# Patient Record
Sex: Male | Born: 1960 | Race: White | Hispanic: No | Marital: Married | Smoking: Never smoker
Health system: Southern US, Community
[De-identification: ages and names within clinical notes are randomized; demographics above are authoritative.]

## PROBLEM LIST (undated history)

## (undated) DIAGNOSIS — Z98811 Dental restoration status: Secondary | ICD-10-CM

## (undated) DIAGNOSIS — K219 Gastro-esophageal reflux disease without esophagitis: Secondary | ICD-10-CM

## (undated) DIAGNOSIS — K222 Esophageal obstruction: Secondary | ICD-10-CM

## (undated) DIAGNOSIS — K921 Melena: Secondary | ICD-10-CM

## (undated) DIAGNOSIS — Z125 Encounter for screening for malignant neoplasm of prostate: Secondary | ICD-10-CM

## (undated) DIAGNOSIS — R739 Hyperglycemia, unspecified: Secondary | ICD-10-CM

## (undated) HISTORY — DX: Esophageal obstruction: K22.2

## (undated) HISTORY — DX: Melena: K92.1

## (undated) HISTORY — DX: Encounter for screening for malignant neoplasm of prostate: Z12.5

## (undated) HISTORY — PX: DENTAL SURGERY: SHX609

## (undated) HISTORY — DX: Hyperglycemia, unspecified: R73.9

## (undated) HISTORY — PX: OTHER SURGICAL HISTORY: SHX169

---

## 2000-04-15 ENCOUNTER — Encounter: Payer: Self-pay | Admitting: Family Medicine

## 2001-10-17 ENCOUNTER — Encounter: Payer: Self-pay | Admitting: Family Medicine

## 2002-11-27 ENCOUNTER — Encounter: Payer: Self-pay | Admitting: Family Medicine

## 2002-11-27 LAB — CONVERTED CEMR LAB
RBC count: 4.8 10*6/uL
TSH: 1.01 microintl units/mL
WBC, blood: 6.8 10*3/uL

## 2003-11-27 ENCOUNTER — Encounter: Payer: Self-pay | Admitting: Family Medicine

## 2003-11-27 LAB — CONVERTED CEMR LAB: TSH: 0.74 microintl units/mL

## 2004-07-19 HISTORY — PX: GUM SURGERY: SHX658

## 2004-10-13 ENCOUNTER — Ambulatory Visit: Payer: Self-pay | Admitting: Family Medicine

## 2005-04-12 ENCOUNTER — Ambulatory Visit: Payer: Self-pay | Admitting: Family Medicine

## 2005-04-12 LAB — CONVERTED CEMR LAB: Blood Glucose, Fasting: 108 mg/dL

## 2005-04-15 ENCOUNTER — Ambulatory Visit: Payer: Self-pay | Admitting: Family Medicine

## 2005-10-17 ENCOUNTER — Encounter: Payer: Self-pay | Admitting: Family Medicine

## 2006-02-16 HISTORY — PX: OTHER SURGICAL HISTORY: SHX169

## 2006-05-25 ENCOUNTER — Ambulatory Visit: Payer: Self-pay | Admitting: Family Medicine

## 2006-05-30 ENCOUNTER — Ambulatory Visit: Payer: Self-pay | Admitting: Family Medicine

## 2007-05-12 ENCOUNTER — Encounter: Payer: Self-pay | Admitting: Family Medicine

## 2007-05-12 DIAGNOSIS — Z8639 Personal history of other endocrine, nutritional and metabolic disease: Secondary | ICD-10-CM

## 2007-05-30 ENCOUNTER — Ambulatory Visit: Payer: Self-pay | Admitting: Family Medicine

## 2007-05-30 LAB — CONVERTED CEMR LAB
Calcium: 9.9 mg/dL (ref 8.4–10.5)
Cholesterol: 182 mg/dL (ref 0–200)
GFR calc Af Amer: 92 mL/min
GFR calc non Af Amer: 76 mL/min
Glucose, Bld: 105 mg/dL — ABNORMAL HIGH (ref 70–99)
LDL Cholesterol: 118 mg/dL — ABNORMAL HIGH (ref 0–99)
Potassium: 4.3 meq/L (ref 3.5–5.1)
Sodium: 140 meq/L (ref 135–145)
Total CHOL/HDL Ratio: 3.4
Triglycerides: 53 mg/dL (ref 0–149)

## 2007-06-01 ENCOUNTER — Ambulatory Visit: Payer: Self-pay | Admitting: Family Medicine

## 2007-07-13 ENCOUNTER — Encounter: Payer: Self-pay | Admitting: Family Medicine

## 2007-07-13 ENCOUNTER — Ambulatory Visit: Payer: Self-pay | Admitting: Gastroenterology

## 2007-07-13 DIAGNOSIS — K222 Esophageal obstruction: Secondary | ICD-10-CM | POA: Insufficient documentation

## 2007-07-13 HISTORY — PX: ESOPHAGOGASTRODUODENOSCOPY: SHX1529

## 2007-08-03 HISTORY — PX: ESOPHAGOGASTRODUODENOSCOPY: SHX1529

## 2007-08-07 ENCOUNTER — Ambulatory Visit: Payer: Self-pay | Admitting: Gastroenterology

## 2007-12-22 ENCOUNTER — Ambulatory Visit: Payer: Self-pay | Admitting: Family Medicine

## 2007-12-22 LAB — CONVERTED CEMR LAB
Eosinophils Absolute: 0.2 10*3/uL (ref 0.0–0.7)
HCT: 41.9 % (ref 39.0–52.0)
Hemoglobin: 14.7 g/dL (ref 13.0–17.0)
MCHC: 35.2 g/dL (ref 30.0–36.0)
MCV: 90.1 fL (ref 78.0–100.0)
Mono Screen: NEGATIVE
Monocytes Absolute: 0.6 10*3/uL (ref 0.1–1.0)
Neutro Abs: 3.6 10*3/uL (ref 1.4–7.7)
Platelets: 206 10*3/uL (ref 150–400)
RDW: 11.8 % (ref 11.5–14.6)

## 2008-01-13 IMAGING — CR DG CHEST 2V
1 series · 3 of 3 positions shown · non-contrast
Comparison: none

REASON FOR EXAM: swallowing difficulty, dysphagia
COMMENTS:

[Series 1: view not recorded · 0.17mm/px · 3 of 3 slices shown]
[im 1/3]
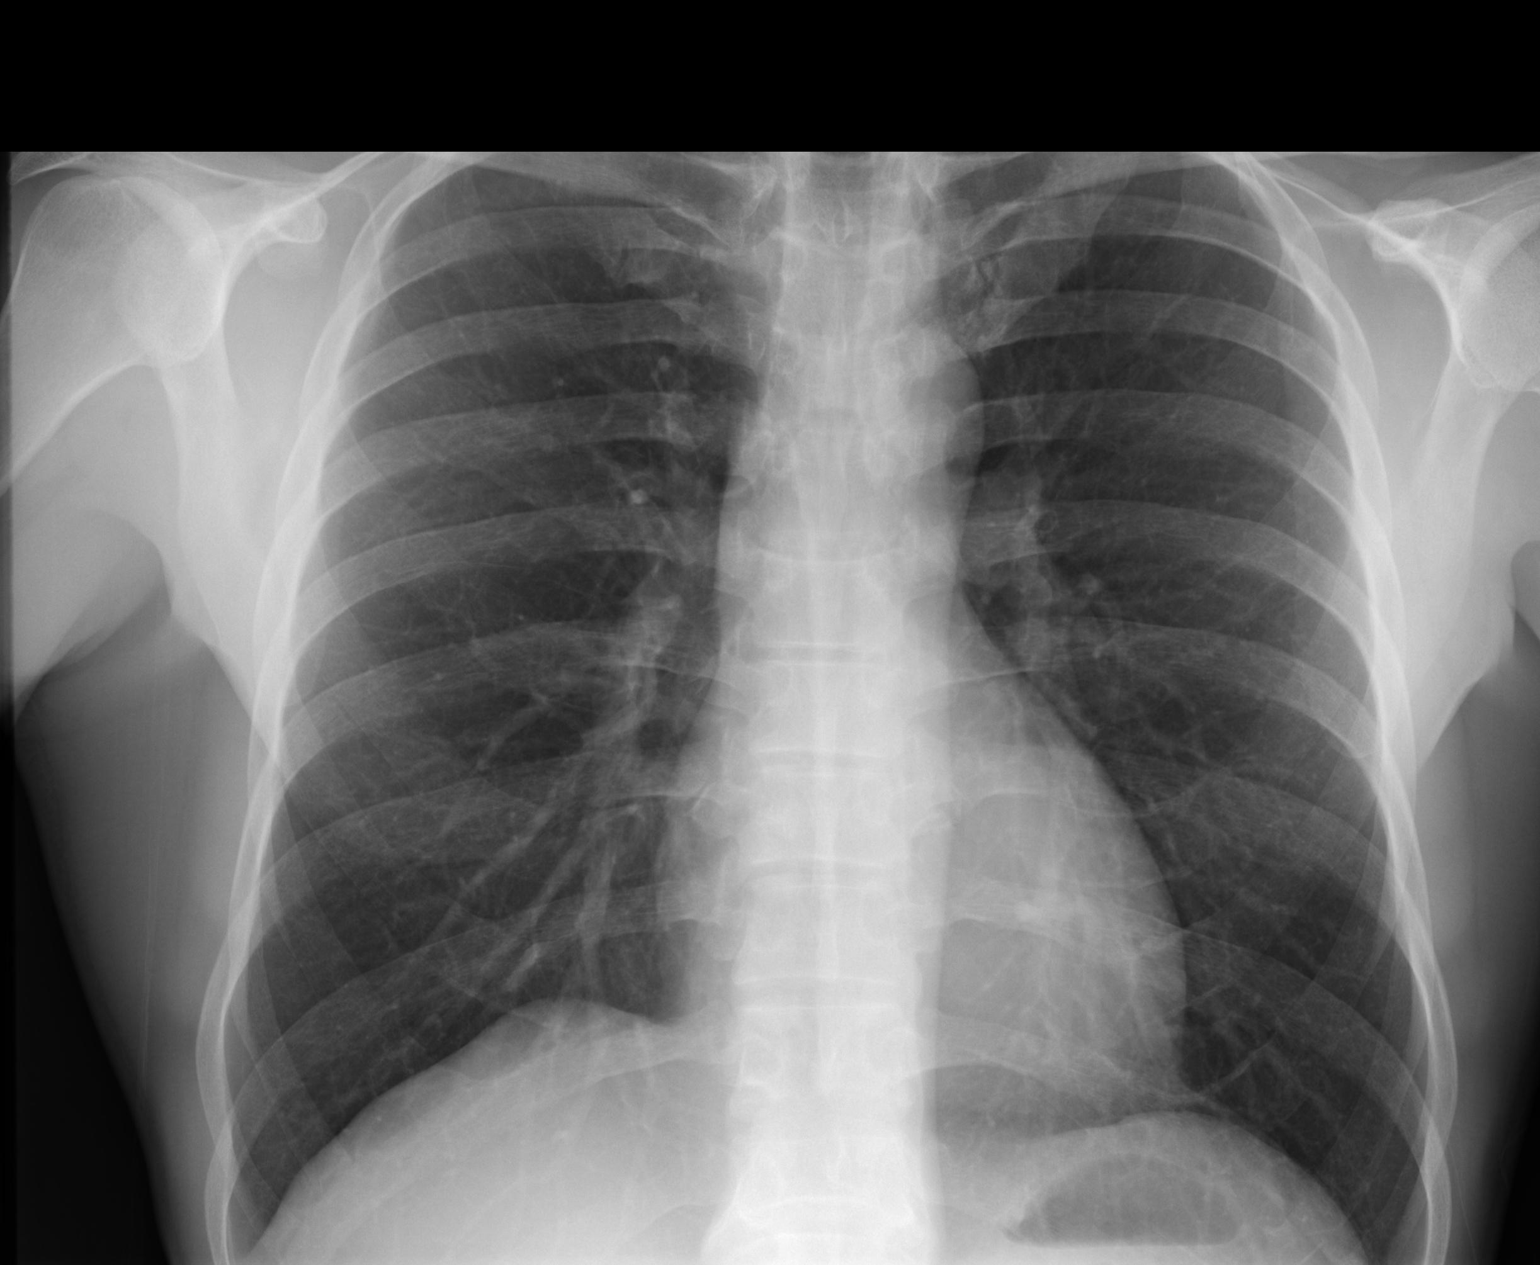
[im 2/3]
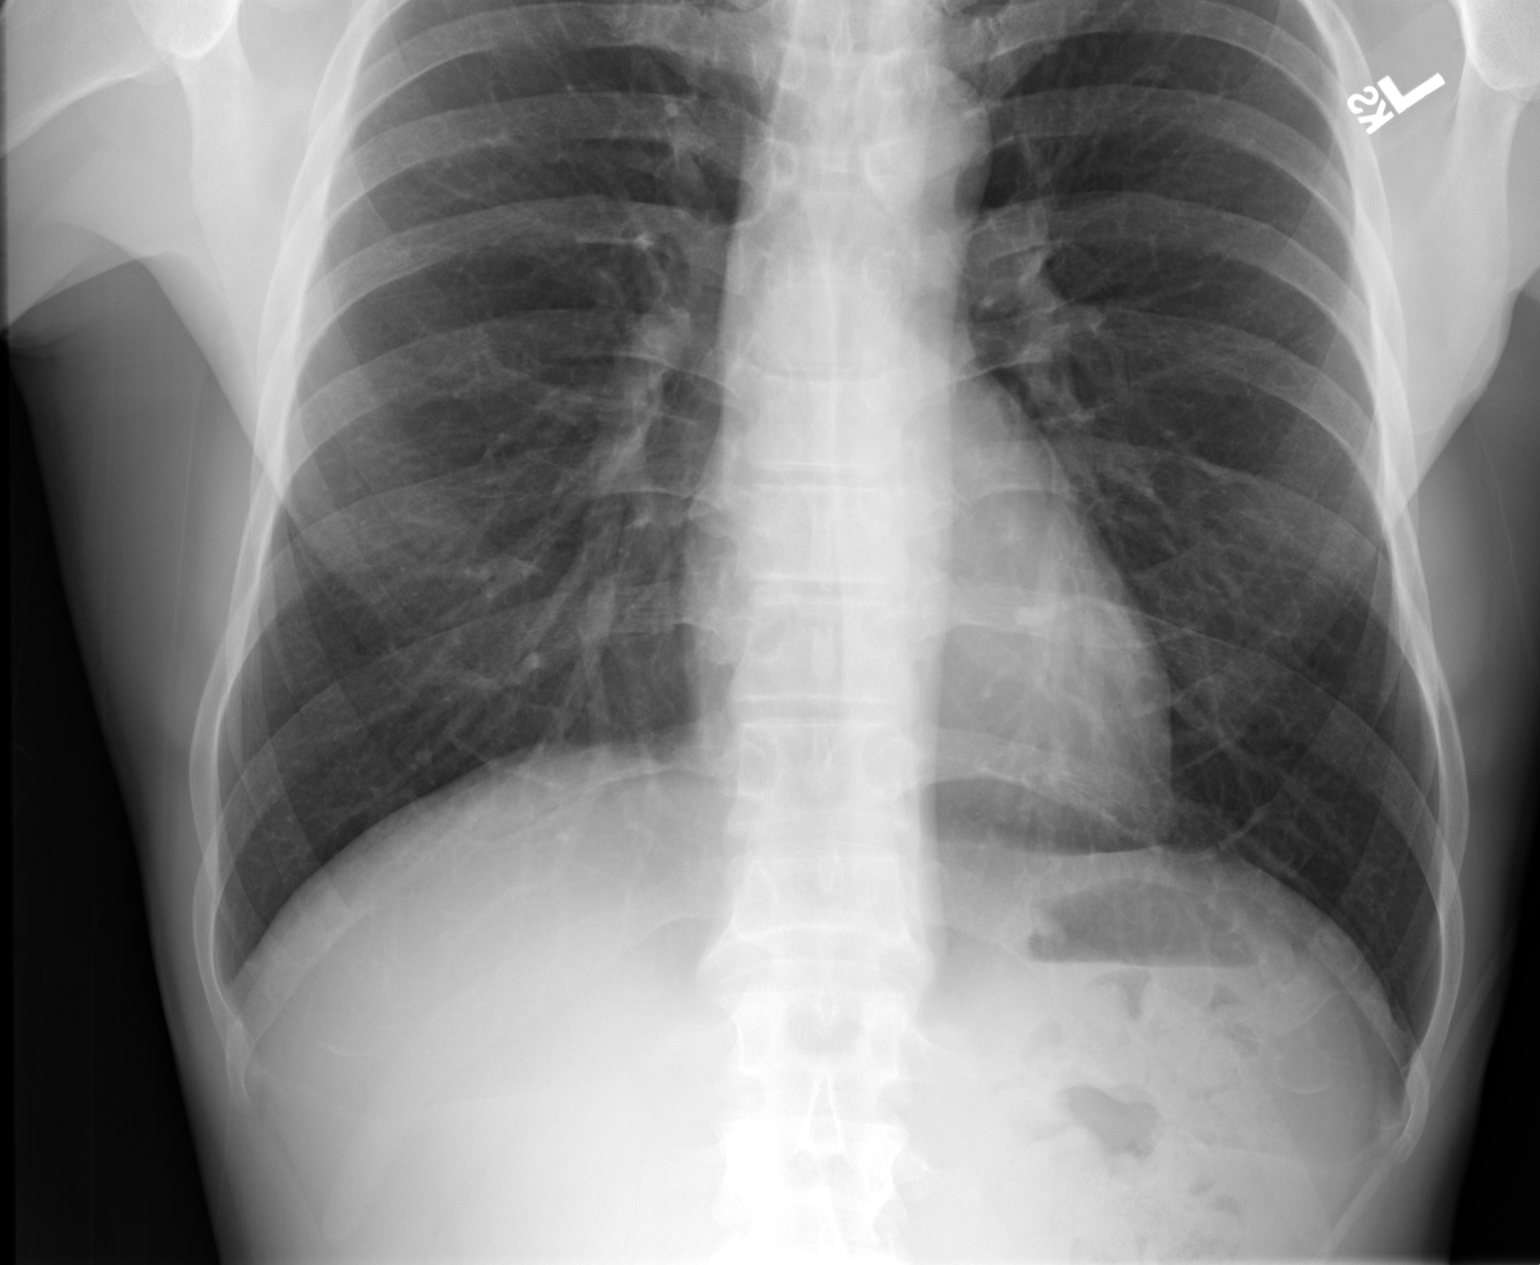
[im 3/3]
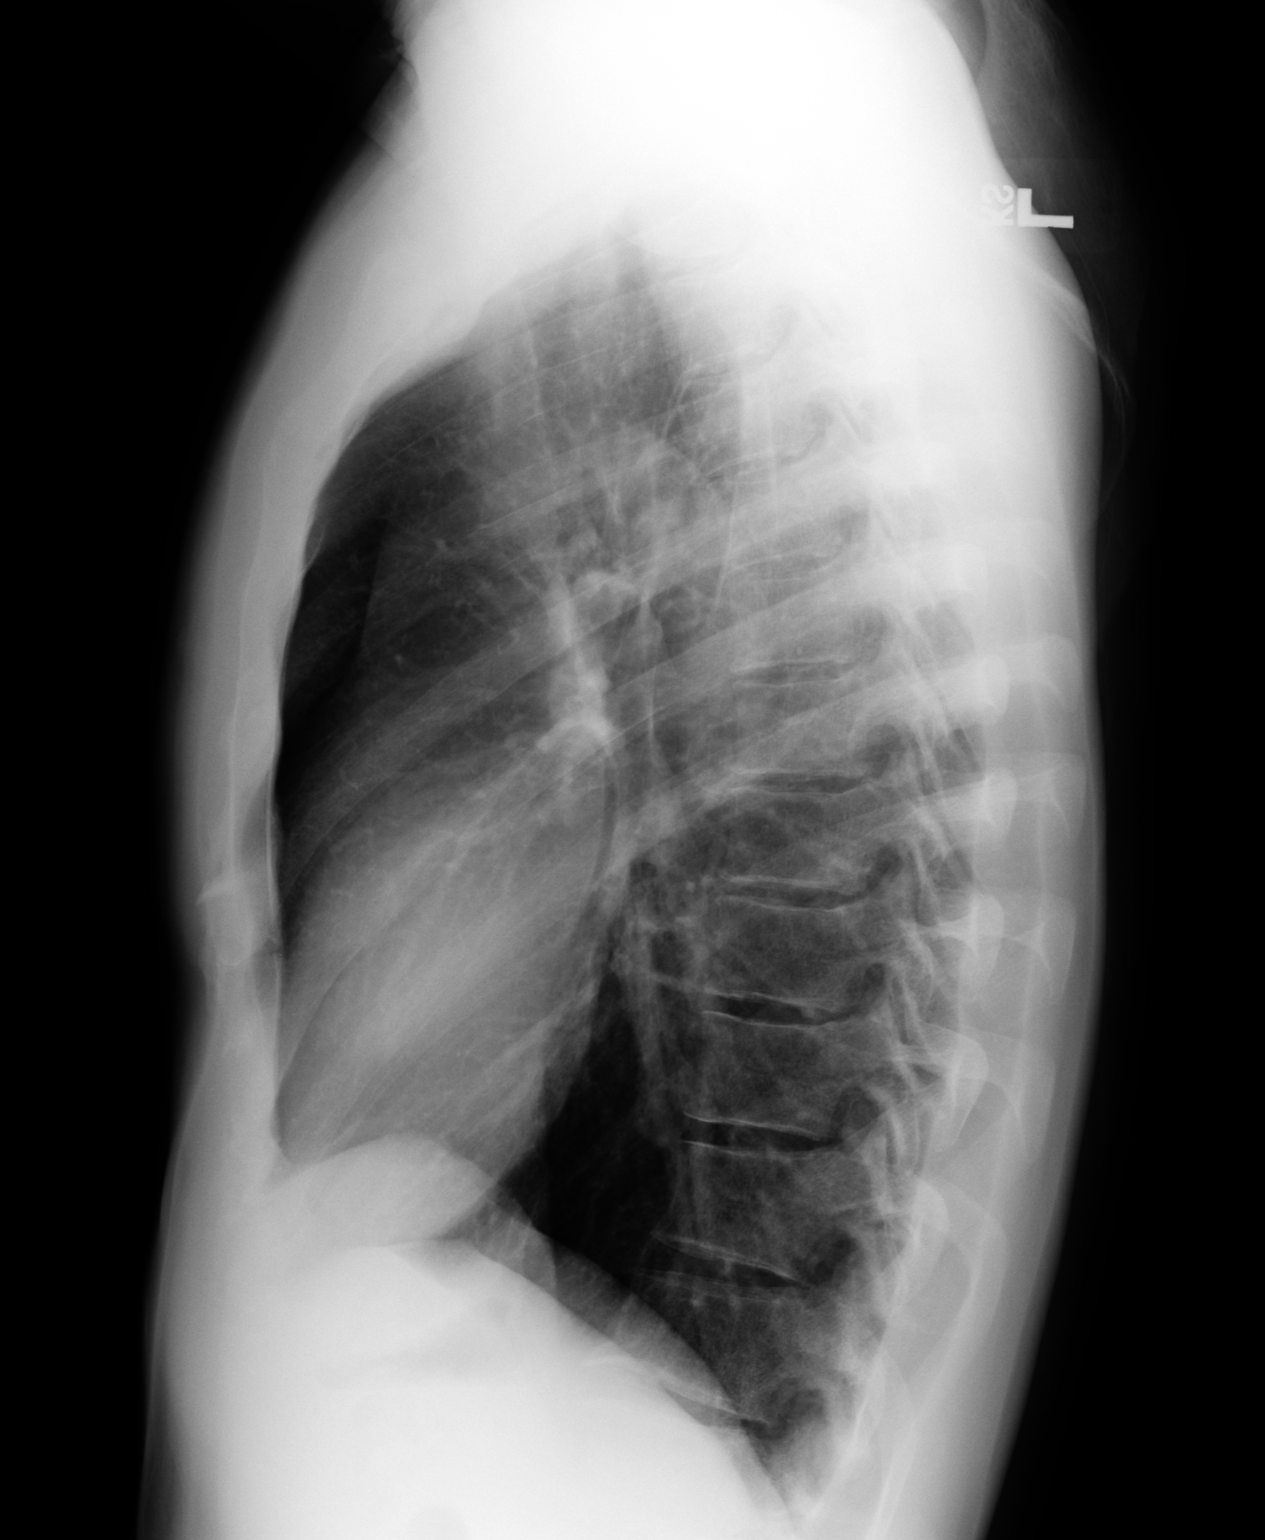

[3 of 3 positions shown; findings below may reference images not displayed]

PROCEDURE:     DXR - DXR CHEST PA (OR AP) AND LATERAL  - July 13, 2007  [DATE]

RESULT:     The lungs are mildly hyperinflated. There is no focal
infiltrate. The heart is normal in size and the pulmonary vascularity is not
engorged. The mediastinum is not widened. The trachea is normal in position.
No air-fluid levels in the region of the esophagus are seen.
IMPRESSION: There is very mild hyperinflation which may be voluntary or may reflect air
trapping. I do not see evidence of pneumonia.

## 2008-06-03 ENCOUNTER — Ambulatory Visit: Payer: Self-pay | Admitting: Family Medicine

## 2008-06-03 LAB — CONVERTED CEMR LAB
AST: 32 units/L (ref 0–37)
Albumin: 4.2 g/dL (ref 3.5–5.2)
Alkaline Phosphatase: 63 units/L (ref 39–117)
BUN: 13 mg/dL (ref 6–23)
Bilirubin, Direct: 0.1 mg/dL (ref 0.0–0.3)
Chloride: 106 meq/L (ref 96–112)
Cholesterol: 178 mg/dL (ref 0–200)
GFR calc Af Amer: 103 mL/min
GFR calc non Af Amer: 85 mL/min
Glucose, Bld: 98 mg/dL (ref 70–99)
Potassium: 4.2 meq/L (ref 3.5–5.1)
Sodium: 141 meq/L (ref 135–145)
Total Protein: 6.9 g/dL (ref 6.0–8.3)

## 2008-06-05 ENCOUNTER — Ambulatory Visit: Payer: Self-pay | Admitting: Family Medicine

## 2008-07-19 HISTORY — PX: COLONOSCOPY: SHX174

## 2009-06-04 ENCOUNTER — Ambulatory Visit: Payer: Self-pay | Admitting: Family Medicine

## 2009-06-04 LAB — CONVERTED CEMR LAB
AST: 28 units/L (ref 0–37)
Albumin: 4.3 g/dL (ref 3.5–5.2)
Alkaline Phosphatase: 62 units/L (ref 39–117)
BUN: 14 mg/dL (ref 6–23)
CO2: 29 meq/L (ref 19–32)
Cholesterol: 178 mg/dL (ref 0–200)
Creatinine,U: 198.6 mg/dL
GFR calc non Af Amer: 75.35 mL/min (ref >60)
Glucose, Bld: 99 mg/dL (ref 70–99)
Microalb Creat Ratio: 2.5 mg/g (ref 0.0–30.0)
Microalb, Ur: 0.5 mg/dL (ref 0.0–1.9)
PSA: 0.64 ng/mL (ref 0.10–4.00)
Potassium: 4.4 meq/L (ref 3.5–5.1)
Sodium: 141 meq/L (ref 135–145)
TSH: 0.98 microintl units/mL (ref 0.35–5.50)
Total Protein: 6.9 g/dL (ref 6.0–8.3)

## 2009-06-09 ENCOUNTER — Ambulatory Visit: Payer: Self-pay | Admitting: Family Medicine

## 2009-06-13 ENCOUNTER — Encounter: Payer: Self-pay | Admitting: Family Medicine

## 2009-07-07 ENCOUNTER — Ambulatory Visit: Payer: Self-pay | Admitting: Gastroenterology

## 2009-07-07 ENCOUNTER — Encounter: Payer: Self-pay | Admitting: Family Medicine

## 2010-02-24 ENCOUNTER — Encounter (INDEPENDENT_AMBULATORY_CARE_PROVIDER_SITE_OTHER): Payer: Self-pay | Admitting: *Deleted

## 2010-06-03 ENCOUNTER — Telehealth (INDEPENDENT_AMBULATORY_CARE_PROVIDER_SITE_OTHER): Payer: Self-pay | Admitting: *Deleted

## 2010-06-10 ENCOUNTER — Ambulatory Visit: Payer: Self-pay | Admitting: Family Medicine

## 2010-06-14 LAB — CONVERTED CEMR LAB
ALT: 15 units/L (ref 0–53)
AST: 24 units/L (ref 0–37)
Albumin: 4.6 g/dL (ref 3.5–5.2)
Alkaline Phosphatase: 65 units/L (ref 39–117)
Calcium: 9.9 mg/dL (ref 8.4–10.5)
Cholesterol: 172 mg/dL (ref 0–200)
Creatinine, Ser: 1.09 mg/dL (ref 0.40–1.50)
HDL: 61 mg/dL (ref >39)
Sodium: 140 meq/L (ref 135–145)
Total Bilirubin: 1.1 mg/dL (ref 0.3–1.2)
Total CHOL/HDL Ratio: 2.8
Triglycerides: 54 mg/dL (ref <150)

## 2010-06-16 ENCOUNTER — Ambulatory Visit: Payer: Self-pay | Admitting: Family Medicine

## 2010-08-18 ENCOUNTER — Ambulatory Visit
Admission: RE | Admit: 2010-08-18 | Discharge: 2010-08-18 | Payer: Self-pay | Source: Home / Self Care | Attending: Family Medicine | Admitting: Family Medicine

## 2010-08-18 ENCOUNTER — Encounter: Payer: Self-pay | Admitting: Family Medicine

## 2010-08-18 NOTE — Consult Note (Signed)
Summary: Dr[facility]kat [facility],Note  Dr[facility]Shaukat [facility],Note   Imported By: Beau Fanny 06/25/2009 16:33:55  _____________________________________________________________________  External Attachment:    Type:   Image     Comment:   External Document

## 2010-08-18 NOTE — Procedures (Signed)
Summary: Colonoscopy by Dr. Ruthine Dose  Colonoscopy by Dr. Ruthine Dose   Imported By: Beau Fanny 07/15/2009 09:47:49  _____________________________________________________________________  External Attachment:    Type:   Image     Comment:   External Document  Appended Document: Colonoscopy by Dr. 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 BRENNAN;(02/2006)    EGD B9 STRICTURE, ESOPH FB,  (DR WOHL) 07/13/2007  REPEAT 3 WKS    EGD DILATION (DR WOHL) 08/03/2007    COLONOSCOPY INT HEMMS (DR Tresa Res) 07/07/2009    Preventive Care Screening  Colonoscopy:    Date:  07/07/2009    Results:  normal

## 2010-08-18 NOTE — Progress Notes (Signed)
----   Converted from flag ---- ---- 06/02/2010 2:07 PM, Crawford Givens MD wrote: cmet lipid 790.29 psa v76.44  ---- 06/02/2010 10:57 AM, Liane Comber CMA (AAMA) wrote: Lab orders please! Good Morning! This pt is scheduled for cpx labs Wed, which labs to draw and dx codes to use? Thanks Tasha ------------------------------

## 2010-08-18 NOTE — Assessment & Plan Note (Signed)
Summary: CPX,TRANSFER FROM DR SCHALLER/CLE   Vital Signs:  Patient profile:   50 year old male Height:      72 inches Weight:      166.25 pounds BMI:     22.63 Temp:     97.4 degrees F oral Pulse rate:   76 / minute Pulse rhythm:   regular BP sitting:   122 / 82  (left arm) Cuff size:   regular  Vitals Entered By: Delilah Shan CMA Duncan Dull) (June 16, 2010 8:47 AM) CC: CPX (RNS pt.), Preventive Care   History of Present Illness: CPE- See prev med. Labs reviewed with patient. Mild hyperglycemia, similar to prev.  D/w patient UU:VOZD and exercise.  See plan.  Feeling well o/w except for occ mild nasal congestion on L side.  No other c/o.   Allergies: No Known Drug Allergies  Past History:  Past Surgical History: Last updated: 07/15/2009 CYSTIC HYGROMA REMOVAL LEFT  1961 CYSTIC HYGROMA REMOVAL LEFT [facility]  1982 GUM SURGERY 2006 LEFT LOWER EYELID LESION REMOVAL--DR. 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 Father: DECEASED 106 YOA MVA Mother: A  HTN CHOL  BASAL CELL CHOLEY Siblings: NONE CV: +3 OD GRANDPARENTS DECEASED FROM CAD, 1 60 YOA ;PGM      //2- 70 YOAM +PGF HBP:+MOTHER DM:+PGF GOUT/ARTHRITIS: PROSTATE CANCER: MGM CHRONIC LEUKEMIA BREAST/OVARIAN/UTERINE CANCER: COLON CANCER: DEPRESSION: NEGATIVE ETOH/DRUG ABUSE: NEGATIVE OTHER: NEGATIVE STROKE  Social History: Reviewed history from 06/01/2007 and no changes required. Marital Status: Married, 1985 LIVES WITH WIFE Children: 2 CHILDREN, both out of  the home Occupation: B[employer]/ [employer] grad 1983 enjoys tennis, water skiing, swimming no tob alcohol: 2 beers a month  Review of Systems       See HPI.  Otherwise negative.    Physical Exam  General:  GEN: nad, alert and oriented HEENT: mucous membranes moist, TM wnl, nasal exam wnl except for slight decrease in L nasal passage diameter compared to R NECK: supple w/o LA, post surgical changes noted, no bruit CV: rrr.  no murmur PULM: ctab, no inc wob ABD: soft, +bs EXT: no edema SKIN: no acute rash  Rectal:  No external abnormalities noted. Normal sphincter tone. No rectal masses or tenderness. Prostate:  Prostate gland firm and smooth, mild enlargement, but no nodularity, tenderness, mass, asymmetry or induration.   Impression & Recommendations:  Problem # 1:  Preventive Health Care (ICD-V70.0) Stool heme neg today, flu declined.  I would recheck only glucose next year.  Cont healthy diet and exercise in meantime.  His lipids and psa are okay this year.  We talked about PSAs and there is no clear indication for yearly screening for him.  Consider recheck in the future.  follow up as needed.  Tdap up to date.   Complete Medication List: 1)  Multivitamins Tabs (Multiple vitamin) .... Take 1 tablet by mouth once a day 2)  Vitamin C 500 Mg Tabs (Ascorbic acid) .... Take 1 tablet by mouth once a day  Colorectal Screening:  Current Recommendations:    Hemoccult: NEG X 1 today  Next Colonoscopy Due:    07/08/2019  PSA Screening:    PSA: 0.49  (06/10/2010)  Immunization & Chemoprophylaxis:    Tetanus vaccine: Tdap  (06/05/2008)  Patient Instructions: 1)  Keep exercising and eating a healthy diet.  Take care.  Think about getting a flu shot.  I would recheck your sugar next year, but I don't see an indication to check your PSA or lipids yearly.  Let me know if you have other concerns.     Orders Added: 1)  Est. Patient 40-64 years  [99396]    Current Allergies (reviewed today): No known allergies

## 2010-08-18 NOTE — Letter (Signed)
Summary: [name] letter  [place] at [facility]  [address] [place], [place] [address]   Phone: [phone]  Fax: [phone]       02/24/2010 MRN: [id number]  [facility] [place] [address] [place], [place]  [address]  Dear Mr. Thomas Welch [facility] [place], and [facility] announce the retirement of Thomas Welch, M.D., from full-time practice at the [facility] office effective January 15, 2010 and his plans of returning part-time.  It is important to [name] and to our practice that you understand that [facility] has seven physicians in our office for your health care needs.  We will continue to offer the same exceptional care that you have today.    [name] has spoken to many of you about his plans for retirement and returning part-time in the fall.   We will continue to work with you through the transition to schedule appointments for you in the office and meet the high standards that [facility] is committed to.   Again, it is with great pleasure that we share the news that [name] will return to [facility] at [facility] in October of 2011 with a reduced schedule.    If you have any questions, or would like to request an appointment with one of our physicians, please call us at [phone] and press the option for Scheduling an appointment.  We take pleasure in providing you with excellent patient care and look forward to seeing you at your next office visit.  Our [facility] Physicians are:  Thomas Welch, M.D. Thomas Welch, M.D. Thomas Welch, M.D. Thomas Welch, M.D. Thomas Welch, M.D. Thomas Welch, M.D. We proudly welcomed Thomas Welch, M.D. and Thomas Welch, M.D. to the practice in July/August 2011.  Sincerely,  [place]

## 2010-08-21 ENCOUNTER — Telehealth: Payer: Self-pay | Admitting: Family Medicine

## 2010-08-24 ENCOUNTER — Other Ambulatory Visit (INDEPENDENT_AMBULATORY_CARE_PROVIDER_SITE_OTHER): Payer: BC Managed Care – PPO

## 2010-08-24 ENCOUNTER — Encounter (INDEPENDENT_AMBULATORY_CARE_PROVIDER_SITE_OTHER): Payer: Self-pay | Admitting: *Deleted

## 2010-08-24 ENCOUNTER — Other Ambulatory Visit: Payer: Self-pay | Admitting: Family Medicine

## 2010-08-24 DIAGNOSIS — Z0000 Encounter for general adult medical examination without abnormal findings: Secondary | ICD-10-CM

## 2010-08-24 DIAGNOSIS — R7309 Other abnormal glucose: Secondary | ICD-10-CM

## 2010-08-24 LAB — GLUCOSE, RANDOM: Glucose, Bld: 106 mg/dL — ABNORMAL HIGH (ref 70–99)

## 2010-08-24 LAB — LIPID PANEL
HDL: 61.5 mg/dL (ref >39.00)
Total CHOL/HDL Ratio: 3

## 2010-08-26 ENCOUNTER — Encounter: Payer: Self-pay | Admitting: Family Medicine

## 2010-08-26 NOTE — Progress Notes (Signed)
Summary: needs labs done for ins   Phone Note Call from Patient Call back at [phone]   Caller: Patient Call For: Thomas Givens MD Summary of Call: Patient had forms filled out for ins company. They will not except the date for his labs becuase they were done in 2011 and they have to be done this year. He is asking if he could come in to get chol and glucose checked. Please advise.  Initial call taken by: Melody Comas,  August 21, 2010 2:53 PM  Follow-up for Phone Call        Patient has faxed over new form to be filled out for his insurance company. I have filled out what I could, the rest of the questions are regarding the labs that he needs. Form is on your desk. Follow-up by: Melody Comas,  August 21, 2010 3:13 PM  Additional Follow-up for Phone Call Additional follow up Details #1::        please get a fasting lipid panel and glucose.  dx v70.3.  I'll fill out the form after that.  Thanks.  Additional Follow-up by: Thomas Givens MD,  August 21, 2010 3:34 PM    Additional Follow-up for Phone Call Additional follow up Details #2::    Lab appointment scheduled  08/24/2010 (fasting).  Patient Advised.  Follow-up by: Delilah Shan CMA Duncan Dull),  August 21, 2010 4:26 PM

## 2010-08-26 NOTE — Assessment & Plan Note (Signed)
Summary: needs forms filled out for insurance/alc   Vital Signs:  Patient profile:   50 year old male Height:      72 inches Weight:      165.50 pounds BMI:     22.53 Temp:     98.3 degrees F oral Pulse rate:   76 / minute Pulse rhythm:   regular BP sitting:   116 / 76  (left arm) Cuff size:   regular  Vitals Entered By: Delilah Shan CMA Jerritt Cardoza Dull) (August 18, 2010 8:12 AM) CC: Needs form filled out for insurance   History of Present Illness: He had a form filled out that he needed for work.  It was about his labs.  D/w patient and form filled out. Feeling well o/w w/o complalints.   Allergies: No Known Drug Allergies  Review of Systems       See HPI.  Otherwise negative.    Physical Exam  General:  no apparent distress remainder deferred.    Impression & Recommendations:  Problem # 1:  OTH GENERAL MEDICAL EXAMINATION ADMIN PURPOSES (ICD-V70.3) labs d/w patient and form filled out.  He has a hard copy of labs.  Form faxed.  follow up as needed.  He agrees.   Complete Medication List: 1)  Multivitamins Tabs (Multiple vitamin) .... Take 1 tablet by mouth once a day 2)  Vitamin C 500 Mg Tabs (Ascorbic acid) .... Take 1 tablet by mouth once a day  Patient Instructions: 1)  Take care.  Let me know if you have concerns.    Orders Added: 1)  Est. Patient Level III [04540]    Current Allergies (reviewed today): No known allergies

## 2010-09-03 NOTE — Letter (Signed)
Summary: Health Provider Screening Health  Health Provider Screening Health   Imported By: Kassie Mends 08/25/2010 11:37:20  _____________________________________________________________________  External Attachment:    Type:   Image     Comment:   External Document

## 2010-09-15 NOTE — Letter (Signed)
Summary: Health Screening Form/[facility]  Health Screening Form/[facility]   Imported By: Lanelle Bal 09/07/2010 15:33:00  _____________________________________________________________________  External Attachment:    Type:   Image     Comment:   External Document

## 2010-12-04 NOTE — Assessment & Plan Note (Signed)
[facility]                                 ON-CALL NOTE   NAME:Thomas Welch, Thomas Welch                          MRN:          [id number]  DATE:07/13/2007                            DOB:          [birth year]    PRIMARY CARE PHYSICIAN:  Arta Silence, MD   The patient calls here stating that during lunch today he was having  difficulty swallowing his food.  He thought it was indigestion.  Later  he tried to drink water and then felt like it was getting stuck in his  throat.  The patient reports that he cannot keep anything down, and it  appears  that there is something stuck in his esophagus.  He does not  appear in any respiratory distress.   I advised the patient that, if he feels there is something stuck in his  throat, he should be seen in the emergency department.  He was wondering  if it could just be indigestion.  I advised him that I cannot make that  diagnosis over the phone, but based on his complaint, further assessment  is definitely warranted, and the best place to go at this time would be  the emergency department.  The patient expressed understanding and is  stable enough to go by private transportation.     Leanne Chang, M.D.  Electronically Signed    LA/MedQ  DD: 07/13/2007  DT: 07/13/2007  Job #: [id number]

## 2011-06-24 ENCOUNTER — Other Ambulatory Visit: Payer: BC Managed Care – PPO

## 2011-06-29 ENCOUNTER — Encounter: Payer: Self-pay | Admitting: Family Medicine

## 2011-06-29 ENCOUNTER — Ambulatory Visit (INDEPENDENT_AMBULATORY_CARE_PROVIDER_SITE_OTHER): Payer: BC Managed Care – PPO | Admitting: Family Medicine

## 2011-06-29 VITALS — BP 132/82 | HR 56 | Temp 98.4°F | Wt 164.0 lb

## 2011-06-29 DIAGNOSIS — Z0000 Encounter for general adult medical examination without abnormal findings: Secondary | ICD-10-CM

## 2011-06-29 DIAGNOSIS — R7309 Other abnormal glucose: Secondary | ICD-10-CM

## 2011-06-29 DIAGNOSIS — R0981 Nasal congestion: Secondary | ICD-10-CM

## 2011-06-29 DIAGNOSIS — J3489 Other specified disorders of nose and nasal sinuses: Secondary | ICD-10-CM

## 2011-06-29 DIAGNOSIS — R739 Hyperglycemia, unspecified: Secondary | ICD-10-CM

## 2011-06-29 DIAGNOSIS — Z23 Encounter for immunization: Secondary | ICD-10-CM

## 2011-06-29 LAB — POCT CBG (FASTING - GLUCOSE)-MANUAL ENTRY: Glucose Fasting, POC: 99 mg/dL (ref 70–99)

## 2011-06-29 NOTE — Progress Notes (Signed)
CPE- See plan.  Routine anticipatory guidance given to patient.  See health maintenance.  Mild nasal stuffiness on L side, worse if laying on R side at night.  Episodic sx, not a new sx per patient.  No pain, bleeding or other concerns.   Glucose not elevated on recheck today.   PMH and SH reviewed  Meds, vitals, and allergies reviewed.   ROS: See HPI.  Otherwise negative.    GEN: nad, alert and oriented HEENT: mucous membranes moist, TM wnl, nasal passage tighter on L than R but no other acute changes noted NECK: supple w/o LA CV: rrr. PULM: ctab, no inc wob ABD: soft, +bs EXT: no edema SKIN: no acute rash Prostate gland firm and smooth, no enlargement, nodularity, tenderness, mass, asymmetry or induration.

## 2011-06-29 NOTE — Patient Instructions (Signed)
Glad to see you.  Take care.  I would get a physical in the summer of 2014.  Call me if you have concerns/questions in the meantime. Merry Christmas.

## 2011-07-01 ENCOUNTER — Encounter: Payer: Self-pay | Admitting: Family Medicine

## 2011-07-01 DIAGNOSIS — R0981 Nasal congestion: Secondary | ICD-10-CM | POA: Insufficient documentation

## 2011-07-01 DIAGNOSIS — Z0000 Encounter for general adult medical examination without abnormal findings: Secondary | ICD-10-CM | POA: Insufficient documentation

## 2011-07-01 NOTE — Assessment & Plan Note (Addendum)
Likely anatomic and with benign exam.  F/u prn.

## 2011-07-01 NOTE — Assessment & Plan Note (Signed)
Normal colonoscopy done 2010 PSA options were discussed along with recent recs.  No indication for psa at this point, since patient is low risk (single relative w/ history of prosate CA, not a first degree relative, dx'd at advanced age) with normal DRE today.  He declined testing of PSA.  We can consider rechecking PSA in the future.  Chol prev wnl, sugar okay today.   Healthy diet and exercise.   Flu shot today.  D/w pt about risk and benefit.  It is reasonable to vaccinate.  Td up to date.   He's like to change CPE to summer, will plan on f/u in 2014, sooner prn.

## 2011-07-01 NOTE — Assessment & Plan Note (Signed)
Normalized today.  Continue healthy diet and exercise.

## 2012-10-21 ENCOUNTER — Other Ambulatory Visit: Payer: Self-pay | Admitting: Family Medicine

## 2012-10-21 DIAGNOSIS — R739 Hyperglycemia, unspecified: Secondary | ICD-10-CM

## 2012-10-25 ENCOUNTER — Other Ambulatory Visit (INDEPENDENT_AMBULATORY_CARE_PROVIDER_SITE_OTHER): Payer: BC Managed Care – PPO

## 2012-10-25 DIAGNOSIS — R7309 Other abnormal glucose: Secondary | ICD-10-CM

## 2012-10-25 DIAGNOSIS — R739 Hyperglycemia, unspecified: Secondary | ICD-10-CM

## 2012-10-25 LAB — BASIC METABOLIC PANEL
GFR: 82.99 mL/min (ref >60.00)
Glucose, Bld: 105 mg/dL — ABNORMAL HIGH (ref 70–99)
Potassium: 4.2 mEq/L (ref 3.5–5.1)
Sodium: 138 mEq/L (ref 135–145)

## 2012-10-25 LAB — LIPID PANEL
Total CHOL/HDL Ratio: 3
VLDL: 11.8 mg/dL (ref 0.0–40.0)

## 2012-10-25 NOTE — Addendum Note (Signed)
Addended by: Baldomero Lamy on: 10/25/2012 07:46 AM   Modules accepted: Orders

## 2012-10-31 ENCOUNTER — Encounter: Payer: Self-pay | Admitting: Family Medicine

## 2012-10-31 ENCOUNTER — Ambulatory Visit (INDEPENDENT_AMBULATORY_CARE_PROVIDER_SITE_OTHER): Payer: BC Managed Care – PPO | Admitting: Family Medicine

## 2012-10-31 VITALS — BP 120/78 | HR 60 | Temp 98.6°F | Ht 72.0 in | Wt 165.2 lb

## 2012-10-31 DIAGNOSIS — Z0000 Encounter for general adult medical examination without abnormal findings: Secondary | ICD-10-CM

## 2012-10-31 DIAGNOSIS — N529 Male erectile dysfunction, unspecified: Secondary | ICD-10-CM

## 2012-10-31 DIAGNOSIS — R0981 Nasal congestion: Secondary | ICD-10-CM

## 2012-10-31 MED ORDER — SILDENAFIL CITRATE 50 MG PO TABS: 25.0000 mg | ORAL_TABLET | Freq: Every day | ORAL | Status: DC | PRN

## 2012-10-31 NOTE — Patient Instructions (Addendum)
Take care.  Rest your elbow and use a cushion as needed.  Glad to see you.  Recheck in one year.  Call back as needed.

## 2012-11-02 DIAGNOSIS — N529 Male erectile dysfunction, unspecified: Secondary | ICD-10-CM | POA: Insufficient documentation

## 2012-11-02 NOTE — Assessment & Plan Note (Signed)
Routine anticipatory guidance given to patient.  See health maintenance. Tetanus 2009 Flu shot encouraged Colonoscopy prev done at age 52 Prostate cancer screening and PSA options (with potential risks and benefits of testing vs not testing) were discussed along with recent recs/guidelines.  He declined testing PSA at this point. DRE unremarkable today.   Diet and exercise d/w pt.  Mild hyperglycemia, but he had normal check on employment physical recently per his report.   Living will d/w pt. He'll consider.

## 2012-11-02 NOTE — Assessment & Plan Note (Signed)
He'll try viagra with routine cautions.  F/u prn.

## 2012-11-02 NOTE — Progress Notes (Signed)
CPE- See plan.  Routine anticipatory guidance given to patient.  See health maintenance. Tetanus 2009 Flu shot encouraged Colonoscopy prev done at age 52 Prostate cancer screening and PSA options (with potential risks and benefits of testing vs not testing) were discussed along with recent recs/guidelines.  He declined testing PSA at this point. DRE unremarkable today.   Diet and exercise d/w pt.  Mild hyperglycemia, but he had normal check on employment physical recently per his report.   Living will d/w pt. He'll consider.    He has R elbow pain with reverse grip pull ups, but not with forward facing grip.  occ pain at the olecranon with local pressure. Overall improved but still with some episodic discomfort.    H/o nasal obstruction if laying on the R side.  Improved laying on L side.  Involves both nares. Improved if he moves the soft tissues to the left.  This is chronic and unchanged overall over an extended period of time.   He has some mild ED and was asking about options.   PMH and SH reviewed  Meds, vitals, and allergies reviewed.   ROS: See HPI.  Otherwise negative.    GEN: nad, alert and oriented HEENT: mucous membranes moist, tm wnl, nasal exam w/o masses seen but slightly enlarged turbinates noted. Mildly deviated septum noted, no L.   NECK: supple w/o LA CV: rrr. PULM: ctab, no inc wob ABD: soft, +bs EXT: no edema SKIN: no acute rash Prostate gland firm and smooth, no enlargement, nodularity, tenderness, mass, asymmetry or induration.

## 2012-11-02 NOTE — Assessment & Plan Note (Signed)
W/o alarming findings on exam.  He could use nasal strips at night if needed.

## 2012-11-03 ENCOUNTER — Other Ambulatory Visit: Payer: BC Managed Care – PPO

## 2012-11-13 ENCOUNTER — Encounter: Payer: BC Managed Care – PPO | Admitting: Family Medicine

## 2013-10-16 ENCOUNTER — Other Ambulatory Visit: Payer: Self-pay | Admitting: Family Medicine

## 2013-10-16 DIAGNOSIS — R7309 Other abnormal glucose: Secondary | ICD-10-CM

## 2013-10-29 ENCOUNTER — Other Ambulatory Visit (INDEPENDENT_AMBULATORY_CARE_PROVIDER_SITE_OTHER): Payer: BC Managed Care – PPO

## 2013-10-29 DIAGNOSIS — R7309 Other abnormal glucose: Secondary | ICD-10-CM

## 2013-10-29 DIAGNOSIS — Z0000 Encounter for general adult medical examination without abnormal findings: Secondary | ICD-10-CM

## 2013-10-29 LAB — BASIC METABOLIC PANEL
BUN: 19 mg/dL (ref 6–23)
CHLORIDE: 103 meq/L (ref 96–112)
CO2: 26 mEq/L (ref 19–32)
Calcium: 9.3 mg/dL (ref 8.4–10.5)
Creatinine, Ser: 1 mg/dL (ref 0.4–1.5)
GFR: 84.63 mL/min (ref >60.00)
GLUCOSE: 90 mg/dL (ref 70–99)
POTASSIUM: 4.1 meq/L (ref 3.5–5.1)
Sodium: 138 mEq/L (ref 135–145)

## 2013-10-29 LAB — LIPID PANEL
CHOLESTEROL: 164 mg/dL (ref 0–200)
HDL: 65 mg/dL (ref >39.00)
LDL Cholesterol: 93 mg/dL (ref 0–99)
Total CHOL/HDL Ratio: 3
Triglycerides: 32 mg/dL (ref 0.0–149.0)
VLDL: 6.4 mg/dL (ref 0.0–40.0)

## 2013-11-01 ENCOUNTER — Encounter: Payer: Self-pay | Admitting: Family Medicine

## 2013-11-01 ENCOUNTER — Ambulatory Visit (INDEPENDENT_AMBULATORY_CARE_PROVIDER_SITE_OTHER): Payer: BC Managed Care – PPO | Admitting: Family Medicine

## 2013-11-01 VITALS — BP 112/68 | HR 56 | Temp 98.4°F | Ht 72.0 in | Wt 162.8 lb

## 2013-11-01 DIAGNOSIS — R109 Unspecified abdominal pain: Secondary | ICD-10-CM

## 2013-11-01 DIAGNOSIS — Z8639 Personal history of other endocrine, nutritional and metabolic disease: Secondary | ICD-10-CM

## 2013-11-01 DIAGNOSIS — Z0000 Encounter for general adult medical examination without abnormal findings: Secondary | ICD-10-CM

## 2013-11-01 DIAGNOSIS — R0981 Nasal congestion: Secondary | ICD-10-CM

## 2013-11-01 NOTE — Progress Notes (Signed)
Pre visit review using our clinic review tool, if applicable. No additional management support is needed unless otherwise documented below in the visit note.  CPE- See plan.  Routine anticipatory guidance given to patient.  See health maintenance. Tetanus 2009 Flu shot encouraged.  Colonoscopy done 2010 Prostate cancer screening and PSA options (with potential risks and benefits of testing vs not testing) were discussed along with recent recs/guidelines.  He declined testing PSA at this point. It would be reasonable to consider checking a PSA at later CPEs.  He agrees.  Diet and exercise- doing well on both.  Hyperglycemia resolved.  Labs d/w pt.   Living will encouraged.  He'll consider.   Prominent L leg veins, noted for years. No sx.    Occ R lower rib pain, worse if prolonged sitting. No GI sx.  Going on intermittently for years.  Seems to be positional.   L nasal passage tighter, improved air flow immediately with breath right strips.  PMH and SH reviewed  Meds, vitals, and allergies reviewed.   ROS: See HPI.  Otherwise negative.    GEN: nad, alert and oriented HEENT: mucous membranes moist, TM w/o erythema. L nasal passage tighter but no mass noted. NECK: supple w/o LA CV: rrr. PULM: ctab, no inc wob ABD: soft, +bs, no RUQ pain, murphy neg EXT: no edema SKIN: no acute rash

## 2013-11-01 NOTE — Patient Instructions (Signed)
Take care.  Glad to see you.  Call with questions.

## 2013-11-02 ENCOUNTER — Encounter: Payer: Self-pay | Admitting: Family Medicine

## 2013-11-02 DIAGNOSIS — R109 Unspecified abdominal pain: Secondary | ICD-10-CM | POA: Insufficient documentation

## 2013-11-02 DIAGNOSIS — Z8042 Family history of malignant neoplasm of prostate: Secondary | ICD-10-CM | POA: Insufficient documentation

## 2013-11-02 NOTE — Assessment & Plan Note (Signed)
Routine anticipatory guidance given to patient.  See health maintenance. Tetanus 2009 Flu shot encouraged.  Colonoscopy done 2010 Prostate cancer screening and PSA options (with potential risks and benefits of testing vs not testing) were discussed along with recent recs/guidelines.  He declined testing PSA at this point. It would be reasonable to consider checking a PSA at later CPEs.  He agrees.  Diet and exercise- doing well on both.  Hyperglycemia resolved.  Labs d/w pt.   Living will encouraged.  He'll consider.

## 2013-11-02 NOTE — Assessment & Plan Note (Signed)
Looks to be anatomic but not from a mass or other concern. Wouldn't intervene other than occ breath right strip.

## 2013-11-02 NOTE — Assessment & Plan Note (Signed)
Likely benign source, positional. F/u prn.  Wouldn't intervene.

## 2013-11-02 NOTE — Assessment & Plan Note (Signed)
Now resolved.  

## 2014-02-12 ENCOUNTER — Ambulatory Visit
Admission: RE | Admit: 2014-02-12 | Discharge: 2014-02-12 | Disposition: A | Discharge status: Home / Self Care | Payer: BC Managed Care – PPO | Source: Ambulatory Visit | Attending: Family Medicine | Admitting: Family Medicine

## 2014-02-12 ENCOUNTER — Encounter (INDEPENDENT_AMBULATORY_CARE_PROVIDER_SITE_OTHER): Payer: Self-pay

## 2014-02-12 ENCOUNTER — Encounter: Payer: Self-pay | Admitting: Family Medicine

## 2014-02-12 ENCOUNTER — Ambulatory Visit (INDEPENDENT_AMBULATORY_CARE_PROVIDER_SITE_OTHER): Payer: BC Managed Care – PPO | Admitting: Family Medicine

## 2014-02-12 VITALS — BP 114/80 | HR 60 | Temp 98.1°F | Wt 164.0 lb

## 2014-02-12 DIAGNOSIS — N5089 Other specified disorders of the male genital organs: Secondary | ICD-10-CM

## 2014-02-12 DIAGNOSIS — N508 Other specified disorders of male genital organs: Secondary | ICD-10-CM

## 2014-02-12 NOTE — Progress Notes (Signed)
Pre visit review using our clinic review tool, if applicable. No additional management support is needed unless otherwise documented below in the visit note.  Lump noted on his L testicle.  Noted in the last week.  Not sore to touch.  Could feel it but not see it.  No other changes in the groin.  Normal exercise routine, w/o pain.  No fevers.  No dysuria.  No known trauma.    Meds, vitals, and allergies reviewed.   ROS: See HPI.  Otherwise, noncontributory.  nad Normal inspection of ext genitalia.   Lump ~1cm noted on superior L testicle.  Likely L>R varicocele noted, d/w pt.

## 2014-02-12 NOTE — Patient Instructions (Signed)
Marion will call about your referral. Take care.  Glad to see you.  

## 2014-02-13 ENCOUNTER — Other Ambulatory Visit: Payer: Self-pay | Admitting: Family Medicine

## 2014-02-13 DIAGNOSIS — N5089 Other specified disorders of the male genital organs: Secondary | ICD-10-CM

## 2014-02-13 NOTE — Assessment & Plan Note (Signed)
Sent for u/s.   Addendum.  I called pt last night. D/w pt. No alarming findings. Would either have him talk to uro now or get f/u u/s in about 3 months. He elects for uro eval. Referral placed.

## 2014-07-22 ENCOUNTER — Ambulatory Visit: Payer: Self-pay | Admitting: Gastroenterology

## 2014-08-13 ENCOUNTER — Encounter: Payer: Self-pay | Admitting: Family Medicine

## 2014-08-15 IMAGING — US US SCROTUM
1 series · 13 of 25 positions shown · non-contrast
Comparison: None.

ADDENDUM:
In the body of the report, under the section 'Left epididymis:' The
the first sentence should read complex cystic and solid lesion
within the left epididymal head measures 1.5 x 1.2 x 1.9 cm.

The laterality of the lesion is correctly described in the
impression.
CLINICAL DATA: Left testicular mass
EXAM:
ULTRASOUND OF SCROTUM
TECHNIQUE: Complete ultrasound examination of the testicles, epididymis, and
other scrotal structures was performed.

[Series 1: us scrotum · 0.08mm/px · 13 of 42 slices shown]
[im 1/42]
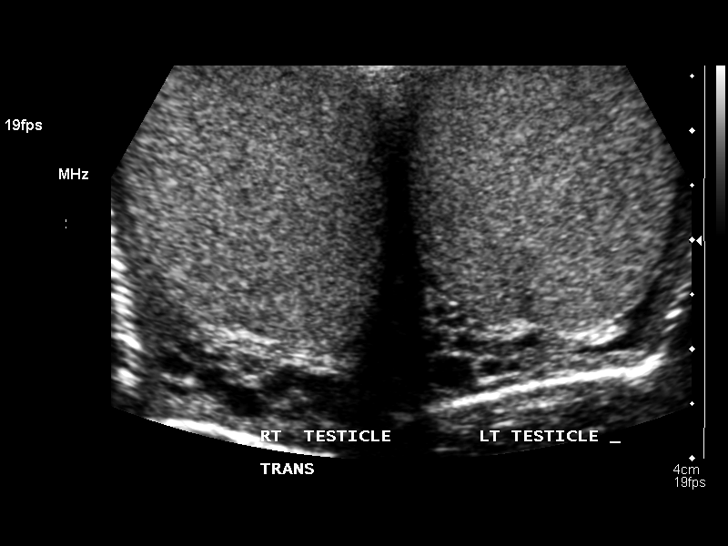
[im 4/42]
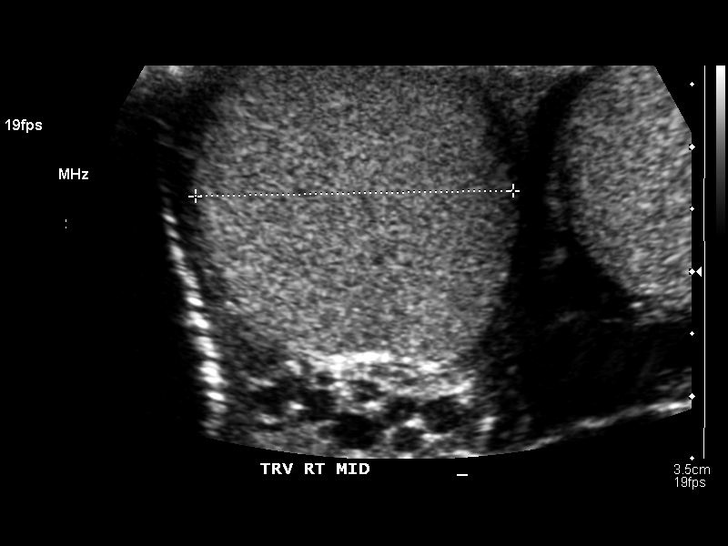
[im 7/42]
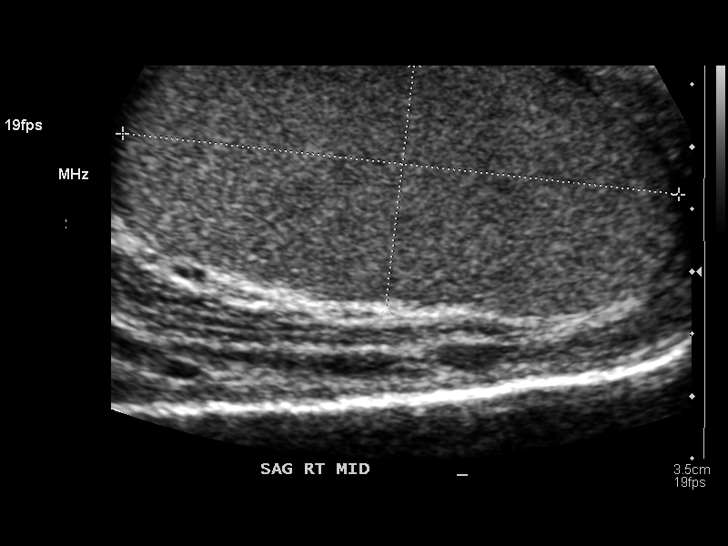
[im 11/42]
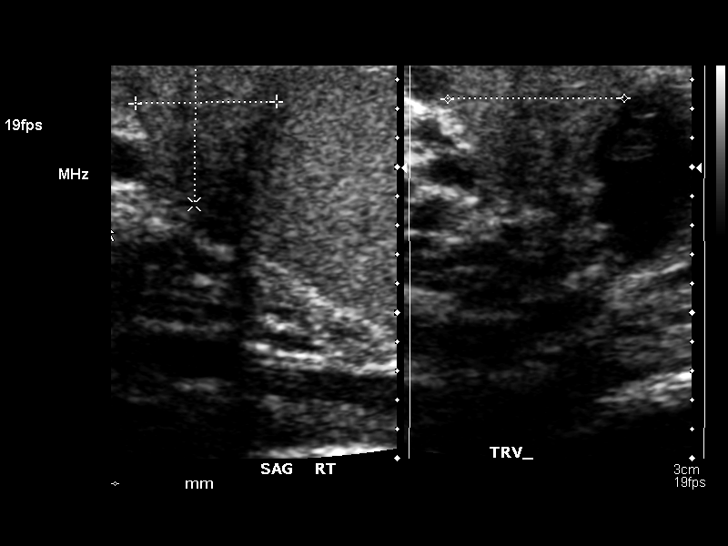
[im 14/42]
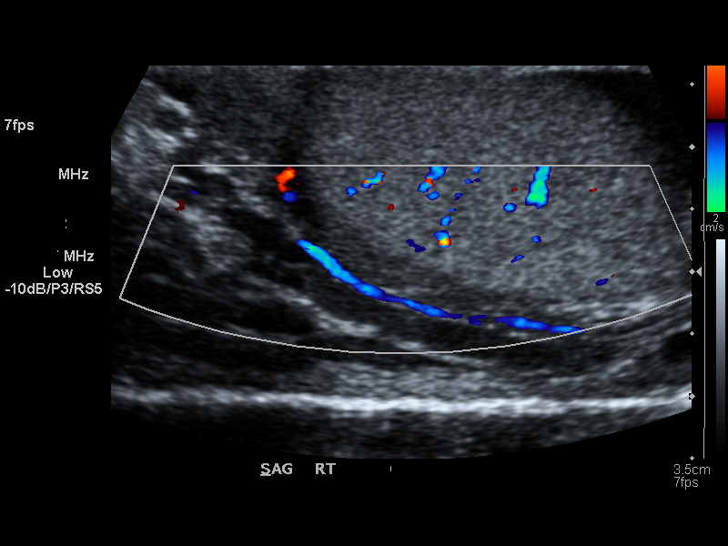
[im 18/42]
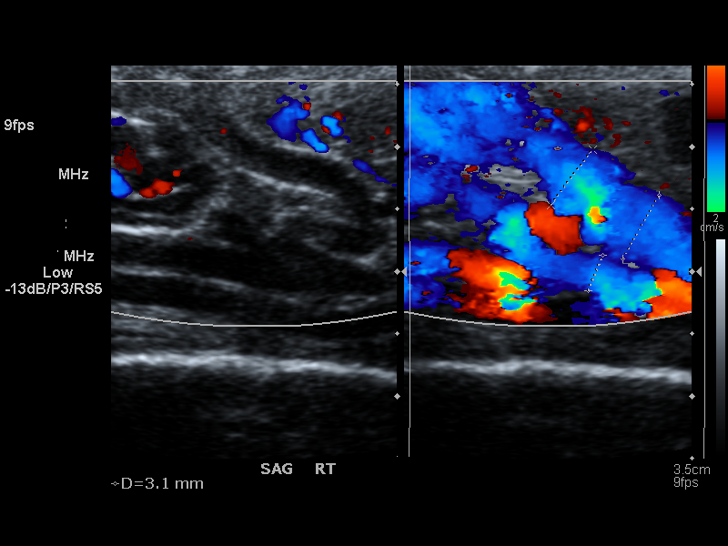
[im 21/42]
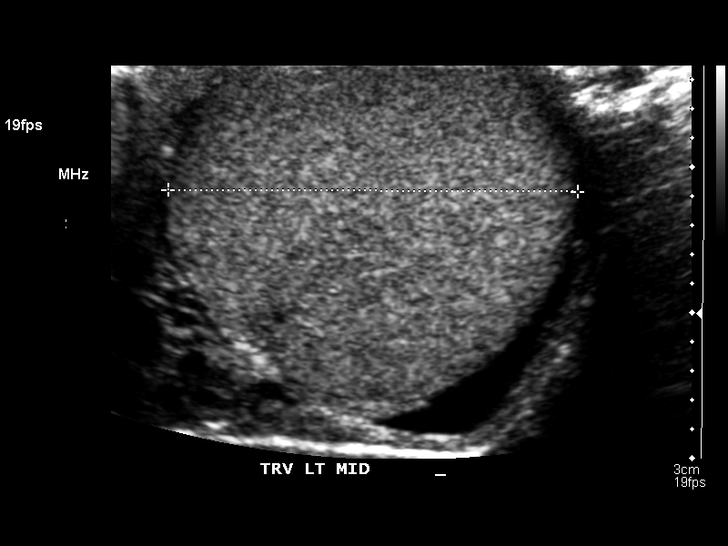
[im 24/42]
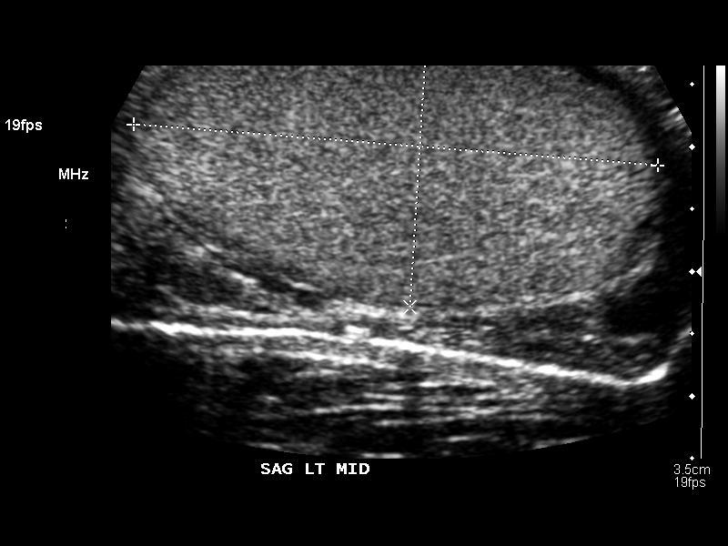
[im 28/42]
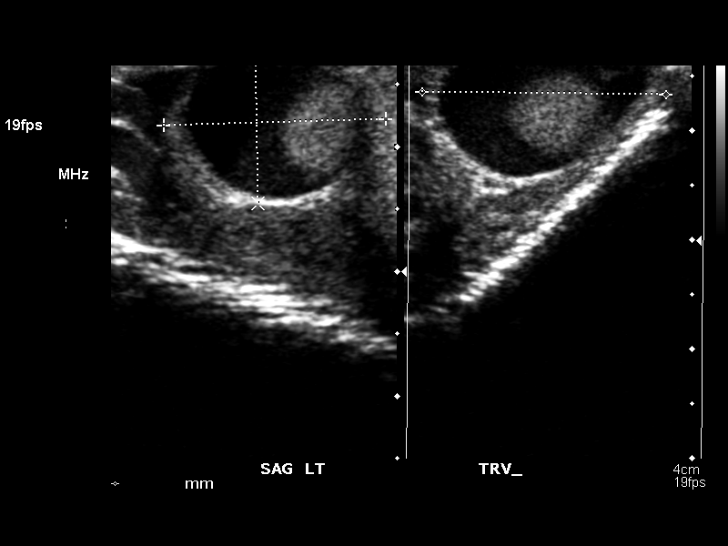
[im 31/42]
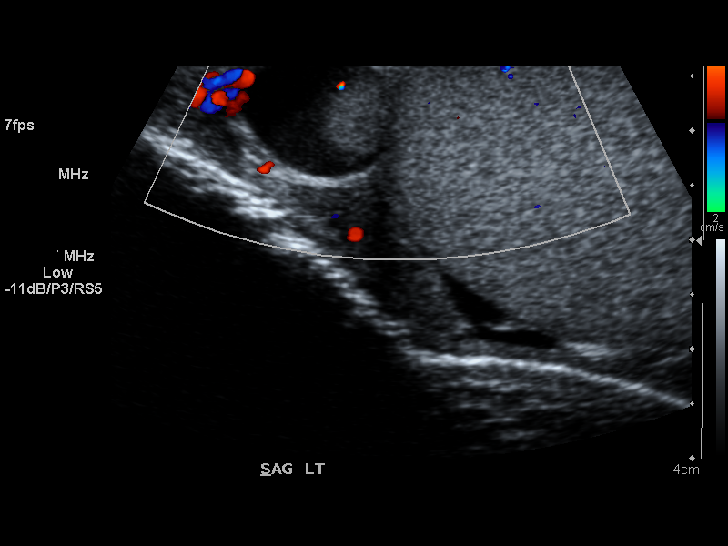
[im 35/42]
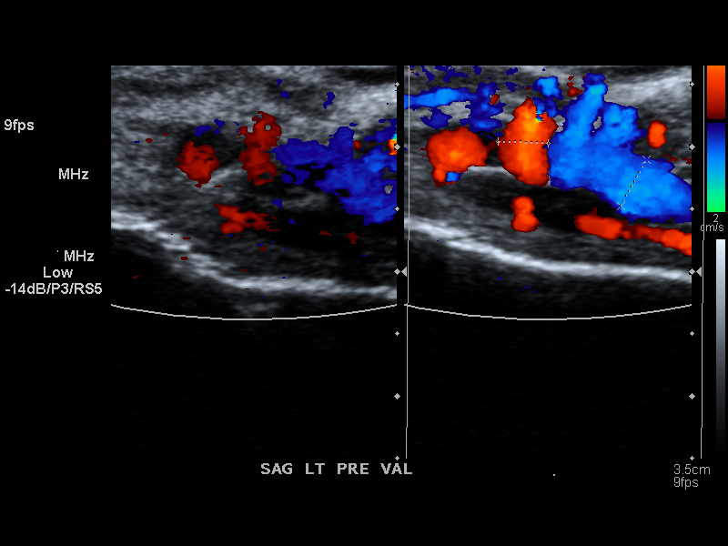
[im 38/42]
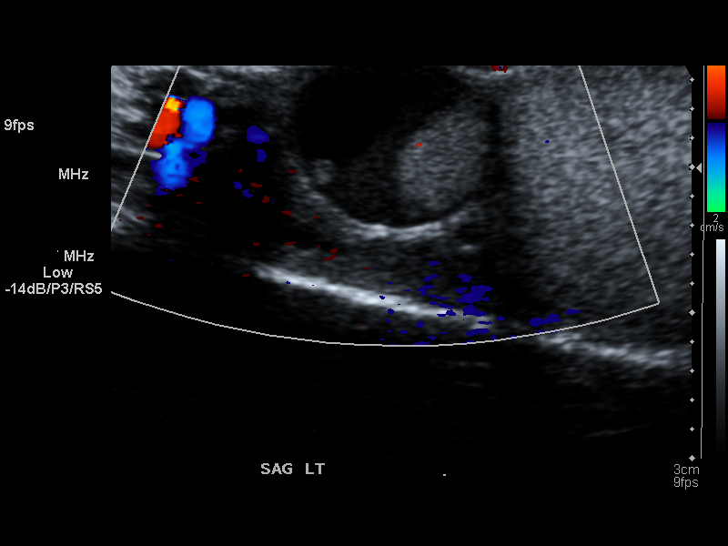
[im 42/42]
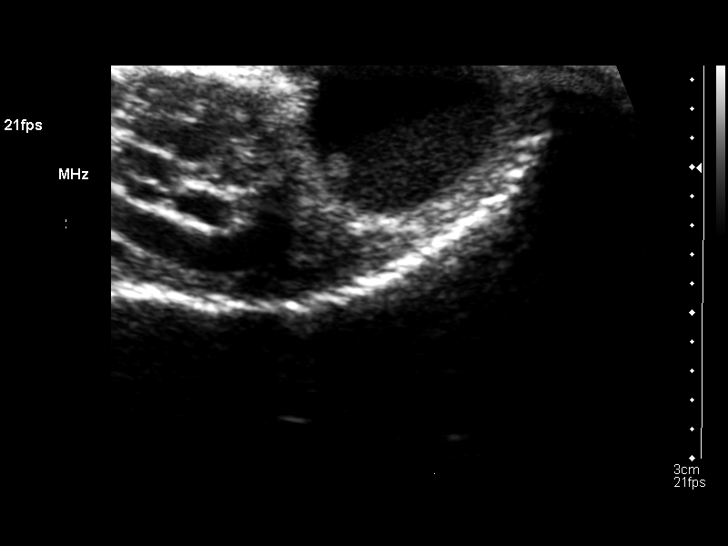

[13 of 25 positions shown; findings below may reference images not displayed]

FINDINGS: Right testicle

Measurements: 4.5 x 2.0 x 2.6 cm. No mass or microlithiasis
visualized.

Left testicle

Measurements: 4.2 x 2.0 x 2.8 cm. No mass or microlithiasis
visualized.

Right epididymis:  Normal in size and appearance.

Left epididymis: Complex cystic and solid lesion within the right
epididymal head measures 1.5 x 1.2 x 1.9 cm. The lesion appears to
represent a minimally complex cyst partially encircling the
epididymal head versus a complex cyst with a solitary soft tissue
mural nodule. The mural nodule demonstrates internal vascularity.
This abnormality correlates with the palpable abnormality.

Hydrocele:  None visualized.

Varicocele: Small bilateral varicoceles appear relatively symmetric.
IMPRESSION: 1. At the site of the palpable abnormality is a 1.5 x 1.2 x 1.9 cm
mildly complex cystic lesion in the left epididymal head. Primary
differential considerations include spermatocele and epididymal head
cyst. Given the predominantly cystic nature of the structure which
appears to surround the epididymal head, an adenomatoid tumor is
considered less likely. Recommend follow-up ultrasound in 3 months
to confirm stability.
2. Small bilateral varicoceles.

## 2014-10-14 ENCOUNTER — Other Ambulatory Visit: Payer: Self-pay | Admitting: Family Medicine

## 2014-10-14 DIAGNOSIS — Z125 Encounter for screening for malignant neoplasm of prostate: Secondary | ICD-10-CM

## 2014-10-14 DIAGNOSIS — Z8639 Personal history of other endocrine, nutritional and metabolic disease: Secondary | ICD-10-CM

## 2014-11-01 ENCOUNTER — Other Ambulatory Visit (INDEPENDENT_AMBULATORY_CARE_PROVIDER_SITE_OTHER): Payer: BLUE CROSS/BLUE SHIELD

## 2014-11-01 DIAGNOSIS — Z125 Encounter for screening for malignant neoplasm of prostate: Secondary | ICD-10-CM | POA: Diagnosis not present

## 2014-11-01 DIAGNOSIS — Z8639 Personal history of other endocrine, nutritional and metabolic disease: Secondary | ICD-10-CM | POA: Diagnosis not present

## 2014-11-01 LAB — BASIC METABOLIC PANEL
BUN: 15 mg/dL (ref 6–23)
CHLORIDE: 103 meq/L (ref 96–112)
CO2: 32 mEq/L (ref 19–32)
Calcium: 10 mg/dL (ref 8.4–10.5)
Creatinine, Ser: 1.06 mg/dL (ref 0.40–1.50)
GFR: 77.01 mL/min (ref >60.00)
GLUCOSE: 91 mg/dL (ref 70–99)
POTASSIUM: 4.4 meq/L (ref 3.5–5.1)
SODIUM: 138 meq/L (ref 135–145)

## 2014-11-01 LAB — PSA: PSA: 0.48 ng/mL (ref 0.10–4.00)

## 2014-11-01 LAB — LIPID PANEL
Cholesterol: 174 mg/dL (ref 0–200)
HDL: 62.2 mg/dL (ref >39.00)
LDL Cholesterol: 103 mg/dL — ABNORMAL HIGH (ref 0–99)
NONHDL: 111.8
Total CHOL/HDL Ratio: 3
Triglycerides: 43 mg/dL (ref 0.0–149.0)
VLDL: 8.6 mg/dL (ref 0.0–40.0)

## 2014-11-04 ENCOUNTER — Encounter: Payer: Self-pay | Admitting: Family Medicine

## 2014-11-04 ENCOUNTER — Ambulatory Visit (INDEPENDENT_AMBULATORY_CARE_PROVIDER_SITE_OTHER): Payer: BLUE CROSS/BLUE SHIELD | Admitting: Family Medicine

## 2014-11-04 VITALS — BP 104/68 | HR 51 | Temp 98.0°F | Ht 72.0 in | Wt 160.0 lb

## 2014-11-04 DIAGNOSIS — Z0000 Encounter for general adult medical examination without abnormal findings: Secondary | ICD-10-CM | POA: Diagnosis not present

## 2014-11-04 DIAGNOSIS — Z7189 Other specified counseling: Secondary | ICD-10-CM | POA: Insufficient documentation

## 2014-11-04 NOTE — Progress Notes (Signed)
Pre visit review using our clinic review tool, if applicable. No additional management support is needed unless otherwise documented below in the visit note.  CPE- See plan.  Routine anticipatory guidance given to patient.  See health maintenance. Tetanus 2009 Flu shot encouraged.  Colonoscopy done 2010 PSA wnl.   Diet and exercise- doing well on both.  Hyperglycemia resolved. Labs d/w pt.  Living will d/w pt.  Wife designated if patient were incapacitated.    He had seen uro and has routine f/u planned for fall 2016.   Had EGD with dilation done, still on PPI per rec and tolerating med.     Doing better qhs with breath right strips.    PMH and SH reviewed  Meds, vitals, and allergies reviewed.   ROS: See HPI.  Otherwise negative.    GEN: nad, alert and oriented HEENT: mucous membranes moist NECK: supple w/o LA CV: rrr. Slightly prominent but not ttp xiphoid.  PULM: ctab, no inc wob ABD: soft, +bs EXT: no edema SKIN: no acute rash

## 2014-11-04 NOTE — Assessment & Plan Note (Addendum)
Routine anticipatory guidance given to patient.  See health maintenance. Tetanus 2009 Flu shot encouraged.  Colonoscopy done 2010 PSA wnl.   Diet and exercise- doing well on both.  Hyperglycemia resolved. Labs d/w pt.  Living will d/w pt.  Wife designated if patient were incapacitated.

## 2014-11-04 NOTE — Patient Instructions (Signed)
Take care.  Glad to see you.  Notify me if you have concerns.   Recheck in about 1 year.

## 2014-11-22 ENCOUNTER — Ambulatory Visit (INDEPENDENT_AMBULATORY_CARE_PROVIDER_SITE_OTHER): Payer: BLUE CROSS/BLUE SHIELD | Admitting: Family Medicine

## 2014-11-22 ENCOUNTER — Encounter: Payer: Self-pay | Admitting: Family Medicine

## 2014-11-22 VITALS — BP 120/60 | HR 48 | Temp 98.0°F | Wt 160.5 lb

## 2014-11-22 DIAGNOSIS — R221 Localized swelling, mass and lump, neck: Secondary | ICD-10-CM | POA: Diagnosis not present

## 2014-11-22 NOTE — Patient Instructions (Signed)
Thomas LimerickMarion will call about your referral. I think these are benign spots, but I want ENT input.  Take care.  Glad to see you.

## 2014-11-22 NOTE — Progress Notes (Signed)
Pre visit review using our clinic review tool, if applicable. No additional management support is needed unless otherwise documented below in the visit note.  Lumps on neck x2, peas sized, noted ~3 weeks ago.  No trauma, no trigger.  Not sore unless he stretches his neck into extension fully.  They got to their approximate size quickly, then levelled off.  No drainage.  No FCNAVD.  Feels well o/w.    Meds, vitals, and allergies reviewed.   ROS: See HPI.  Otherwise, noncontributory.  nad ncat Tm wnl B Nasal and Op exam wnl Neck with chronic L sided postsurgical changes.  2 small lumps noted just to right of midline, both pea sized, not ttp.  Superficial.  He has a strip of thickened skin extending vertically along the upper midline neck to the chin, noted more with next in extension.   No other LA noted in the neck.

## 2014-11-24 DIAGNOSIS — R221 Localized swelling, mass and lump, neck: Secondary | ICD-10-CM | POA: Insufficient documentation

## 2014-11-24 NOTE — Assessment & Plan Note (Signed)
They are small, persistent for about 3 weeks. Not ttp.  They don't feel typical for a LN.  It would be unusual to have a thyroglossal duct cyst at this point.  I talked with patient about this.  His situation doesn't appear ominous, but I would like ENT input.  He agrees.  Will refer.  If the lesions resolve in the meantime (ie c/w reactive lymphadenopathy), then he'll cancel the appointment.  App ENT input.

## 2015-01-27 ENCOUNTER — Telehealth: Payer: Self-pay | Admitting: Gastroenterology

## 2015-01-27 NOTE — Telephone Encounter (Signed)
Pt would like a call back regarding medication.

## 2015-01-28 NOTE — Telephone Encounter (Signed)
Pt was wanting to let me know he was stopping his Protonix.

## 2015-02-24 ENCOUNTER — Other Ambulatory Visit: Payer: Self-pay | Admitting: Gastroenterology

## 2015-02-24 DIAGNOSIS — K219 Gastro-esophageal reflux disease without esophagitis: Secondary | ICD-10-CM

## 2015-07-09 ENCOUNTER — Other Ambulatory Visit: Payer: Self-pay | Admitting: Gastroenterology

## 2015-07-15 ENCOUNTER — Ambulatory Visit (INDEPENDENT_AMBULATORY_CARE_PROVIDER_SITE_OTHER): Payer: BLUE CROSS/BLUE SHIELD | Admitting: Family Medicine

## 2015-07-15 ENCOUNTER — Encounter (INDEPENDENT_AMBULATORY_CARE_PROVIDER_SITE_OTHER): Payer: Self-pay

## 2015-07-15 ENCOUNTER — Encounter: Payer: Self-pay | Admitting: Family Medicine

## 2015-07-15 VITALS — BP 102/66 | HR 76 | Temp 97.5°F | Wt 165.8 lb

## 2015-07-15 DIAGNOSIS — K222 Esophageal obstruction: Secondary | ICD-10-CM | POA: Diagnosis not present

## 2015-07-15 DIAGNOSIS — K219 Gastro-esophageal reflux disease without esophagitis: Secondary | ICD-10-CM

## 2015-07-15 MED ORDER — PANTOPRAZOLE SODIUM 40 MG PO TBEC: DELAYED_RELEASE_TABLET | ORAL | Status: DC

## 2015-07-15 NOTE — Progress Notes (Signed)
Pre visit review using our clinic review tool, if applicable. No additional management support is needed unless otherwise documented below in the visit note.  abd pain.  He has some RUQ pain previously.  He would notice it sleeping on his side occ.  He has some "mild annoying digestive issues" in 05/2015.  Needing to burp, then would feel better after burping.   He would notice more reflux sx when working out hard.   He didn't have "real pain, but it was more annoying."  He has weaned off PPI in the last few months.  He restarted PPI once recently and the sx got better the next day.   No pain in the last few days, after making the appointment today.  Sx were in the RUQ and nowhere else in the abd.  No blood in stool, no vomiting.   Still active and feeling well o/w.     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    Meds, vitals, and allergies reviewed.   ROS: See HPI.  Otherwise, noncontributory.  nad ncat Mmm Neck supple, no LA rrr ctab abd soft, not ttp, RUQ esp not ttp, murphy neg, normal BS, no rebound No bruising

## 2015-07-15 NOTE — Assessment & Plan Note (Signed)
With likely GERD causing the oral sx during working out (likely from inc abd pressure) and the RUQ pain, both now resolved.  No other sign of ominous process.  D/w pt.  Would take PPI 2-3 times per week.  He may be able to limit med use and still get benefit (dec in sx, protection of esophagus) given that he has known HH.  He agrees.  Update me as needed.

## 2015-07-15 NOTE — Patient Instructions (Signed)
I would try taking the protonix 2-3 times a week and update me if needed.  Take care.  Glad to see you.

## 2015-08-15 ENCOUNTER — Encounter: Payer: Self-pay | Admitting: Family Medicine

## 2015-08-15 ENCOUNTER — Ambulatory Visit (INDEPENDENT_AMBULATORY_CARE_PROVIDER_SITE_OTHER): Payer: BLUE CROSS/BLUE SHIELD | Admitting: Family Medicine

## 2015-08-15 VITALS — BP 110/70 | HR 56 | Temp 98.3°F | Wt 165.5 lb

## 2015-08-15 DIAGNOSIS — K4090 Unilateral inguinal hernia, without obstruction or gangrene, not specified as recurrent: Secondary | ICD-10-CM

## 2015-08-15 NOTE — Progress Notes (Signed)
Pre visit review using our clinic review tool, if applicable. No additional management support is needed unless otherwise documented below in the visit note.  After the last OV, he noted a lump in the R groin.  He didn't know if had a hernia.  Noted after exercising.  Not seen at the time, but when present it is still in the same place.  He hadn't noted, but hadn't looked for it, during exercise.  Minimal pain.  No L sided sx.  Frequent exercise.  No erythema, no drainage.  Meds, vitals, and allergies reviewed.   ROS: See HPI.  Otherwise, noncontributory.  nad ncat abd soft, not ttp but RIH noted laying and standing, with cough.  Easily reduced.  No L sided sx.

## 2015-08-15 NOTE — Patient Instructions (Signed)
Likely right inguinal hernia.  If worsening, then we'll set you up with the general surgeons.  Exercise as tolerated.   Take care.  Glad to see you.

## 2015-08-15 NOTE — Progress Notes (Signed)
Pre visit review using our clinic review tool, if applicable. No additional management support is needed unless otherwise documented below in the visit note. 

## 2015-08-17 DIAGNOSIS — K4090 Unilateral inguinal hernia, without obstruction or gangrene, not specified as recurrent: Secondary | ICD-10-CM | POA: Insufficient documentation

## 2015-08-17 NOTE — Assessment & Plan Note (Signed)
D/w pt.  He wants to keep exercising.  Really small, not really bothersome.   D/w pt about options.  We can send to general surgery at any point, when desired.   As small as it is, likely doesn't need repair now.  He doesn't really want surgery now.  If not enlarging, then just continue as is.  If enlarging, we can refer.  Okay to be as active as the [facility] allows.  Routine cautions re: strangulated hernia d/w pt.  F/u prn.  He agrees.

## 2015-10-01 ENCOUNTER — Other Ambulatory Visit: Payer: Self-pay

## 2015-10-01 DIAGNOSIS — K219 Gastro-esophageal reflux disease without esophagitis: Secondary | ICD-10-CM

## 2015-10-01 MED ORDER — PANTOPRAZOLE SODIUM 40 MG PO TBEC: 40.0000 mg | DELAYED_RELEASE_TABLET | Freq: Every day | ORAL | Status: DC

## 2015-10-29 ENCOUNTER — Other Ambulatory Visit: Payer: BLUE CROSS/BLUE SHIELD

## 2015-11-02 ENCOUNTER — Other Ambulatory Visit: Payer: Self-pay | Admitting: Family Medicine

## 2015-11-02 DIAGNOSIS — Z125 Encounter for screening for malignant neoplasm of prostate: Secondary | ICD-10-CM

## 2015-11-02 DIAGNOSIS — Z8639 Personal history of other endocrine, nutritional and metabolic disease: Secondary | ICD-10-CM

## 2015-11-04 ENCOUNTER — Other Ambulatory Visit (INDEPENDENT_AMBULATORY_CARE_PROVIDER_SITE_OTHER): Payer: BLUE CROSS/BLUE SHIELD

## 2015-11-04 DIAGNOSIS — Z8639 Personal history of other endocrine, nutritional and metabolic disease: Secondary | ICD-10-CM | POA: Diagnosis not present

## 2015-11-04 DIAGNOSIS — Z125 Encounter for screening for malignant neoplasm of prostate: Secondary | ICD-10-CM | POA: Diagnosis not present

## 2015-11-04 LAB — PSA: PSA: 0.54 ng/mL (ref 0.10–4.00)

## 2015-11-04 LAB — BASIC METABOLIC PANEL
BUN: 15 mg/dL (ref 6–23)
CO2: 31 mEq/L (ref 19–32)
Calcium: 9.7 mg/dL (ref 8.4–10.5)
Chloride: 103 mEq/L (ref 96–112)
Creatinine, Ser: 1.04 mg/dL (ref 0.40–1.50)
GFR: 78.43 mL/min (ref >60.00)
Glucose, Bld: 97 mg/dL (ref 70–99)
Potassium: 4.1 mEq/L (ref 3.5–5.1)
Sodium: 139 mEq/L (ref 135–145)

## 2015-11-04 LAB — LIPID PANEL
CHOLESTEROL: 164 mg/dL (ref 0–200)
HDL: 56 mg/dL (ref >39.00)
LDL CALC: 98 mg/dL (ref 0–99)
NONHDL: 108.18
Total CHOL/HDL Ratio: 3
Triglycerides: 49 mg/dL (ref 0.0–149.0)
VLDL: 9.8 mg/dL (ref 0.0–40.0)

## 2015-11-07 ENCOUNTER — Encounter: Payer: BLUE CROSS/BLUE SHIELD | Admitting: Family Medicine

## 2015-11-10 ENCOUNTER — Encounter: Payer: Self-pay | Admitting: Family Medicine

## 2015-11-10 ENCOUNTER — Ambulatory Visit (INDEPENDENT_AMBULATORY_CARE_PROVIDER_SITE_OTHER): Payer: BLUE CROSS/BLUE SHIELD | Admitting: Family Medicine

## 2015-11-10 VITALS — BP 108/72 | HR 51 | Temp 98.0°F | Ht 72.0 in | Wt 161.0 lb

## 2015-11-10 DIAGNOSIS — K4090 Unilateral inguinal hernia, without obstruction or gangrene, not specified as recurrent: Secondary | ICD-10-CM

## 2015-11-10 DIAGNOSIS — Z0000 Encounter for general adult medical examination without abnormal findings: Secondary | ICD-10-CM | POA: Diagnosis not present

## 2015-11-10 DIAGNOSIS — Z119 Encounter for screening for infectious and parasitic diseases, unspecified: Secondary | ICD-10-CM

## 2015-11-10 MED ORDER — TADALAFIL 5 MG PO TABS: 5.0000 mg | ORAL_TABLET | Freq: Every day | ORAL | Status: DC | PRN

## 2015-11-10 MED ORDER — PANTOPRAZOLE SODIUM 40 MG PO TBEC: DELAYED_RELEASE_TABLET | ORAL | Status: DC

## 2015-11-10 NOTE — Progress Notes (Signed)
Pre visit review using our clinic review tool, if applicable. No additional management support is needed unless otherwise documented below in the visit note.  CPE- See plan.  Routine anticipatory guidance given to patient.  See health maintenance. Tetanus 2009 Flu shot encouraged Colonoscopy done 2010 PSA wnl.  Diet and exercise- doing well on both.  Hyperglycemia resolved. Labs d/w pt.  Living will d/w pt. Wife designated if patient were incapacitated.  Pt opts in for HCV and HIV screening.  D/w pt re: routine screening.    Had used cialis prn with good effect.  Wanted refill.    Still with mild RIH sx.  Not much worse recently.  Can see a lump and can move it back in w/o difficulty.   PMH and SH reviewed  Meds, vitals, and allergies reviewed.   ROS: See HPI.  Otherwise negative.    GEN: nad, alert and oriented HEENT: mucous membranes moist NECK: supple w/o LA CV: rrr. PULM: ctab, no inc wob ABD: soft, +bs EXT: no edema SKIN: no acute rash

## 2015-11-10 NOTE — Patient Instructions (Addendum)
If the hernia is worse, then update me and we can send you to the surgeons.  If you want to get it fixed on a schedule (ie January), then call a month or two ahead of time and I'll put in the referral.  Take care.  Glad to see you.

## 2015-11-10 NOTE — Assessment & Plan Note (Signed)
Likely reasonable to put off repair for now, he'll update me as needed and we can refer if needed.  He wants to wait until winter when he is less likely to miss out on enjoyable activities and exercise.

## 2015-11-10 NOTE — Assessment & Plan Note (Signed)
Tetanus 2009  Flu shot encouraged  Colonoscopy done 2010  PSA wnl.  Diet and exercise- doing well on both.  Hyperglycemia resolved. Labs d/w pt.  Living will d/w pt. Wife designated if patient were incapacitated.  Pt opts in for HCV and HIV screening. D/w pt re: routine screening.  Okay to use prn cialis.  F/u prn.

## 2016-01-28 ENCOUNTER — Telehealth: Payer: Self-pay | Admitting: Family Medicine

## 2016-01-28 DIAGNOSIS — K4090 Unilateral inguinal hernia, without obstruction or gangrene, not specified as recurrent: Secondary | ICD-10-CM

## 2016-01-28 NOTE — Telephone Encounter (Signed)
Pt called and would like a referral to Dr. Elmer RampJeff Burnette, general surgeon for his hernia.  He would like Dr. Diego CoryBurnette's office to call him directly to schedule at [phone] (due to his busy schedule).

## 2016-01-29 ENCOUNTER — Encounter: Payer: Self-pay | Admitting: *Deleted

## 2016-01-29 NOTE — Telephone Encounter (Signed)
Ordered. Thanks

## 2016-01-29 NOTE — Telephone Encounter (Signed)
Called  [facility] spoke with Lurena Joinerebecca and gave her the patients phone number to call him directly to schedule with Dr Lemar LivingsByrnett.

## 2016-02-12 ENCOUNTER — Ambulatory Visit (INDEPENDENT_AMBULATORY_CARE_PROVIDER_SITE_OTHER): Payer: BLUE CROSS/BLUE SHIELD | Admitting: General Surgery

## 2016-02-12 ENCOUNTER — Encounter: Payer: Self-pay | Admitting: General Surgery

## 2016-02-12 VITALS — BP 134/64 | HR 59 | Resp 14 | Ht 73.0 in | Wt 163.0 lb

## 2016-02-12 DIAGNOSIS — K4090 Unilateral inguinal hernia, without obstruction or gangrene, not specified as recurrent: Secondary | ICD-10-CM | POA: Diagnosis not present

## 2016-02-12 NOTE — Patient Instructions (Addendum)

## 2016-02-12 NOTE — Progress Notes (Signed)
Patient ID: Thomas Welch, male   DOB: 07-28-1960, 55 y.o.   MRN: [id number]  Chief Complaint  Patient presents with  . Other    HPI Thomas Welch is a 55 y.o. male here today for a evaluation of a inguinal hernia. Patient states he noticed this since 06/2015. He denies pain. He state it does protrude out at times.  Moves his bowels regularly. It bothers him at times.   The patient reports no urinary difficulty.  The patient reports for the last 2 months he has been unable to run which is his summer exercise due to pain in the heel and a lesser extent adjacent to the Achilles tendon. He's made use of no OTC medications for this, and part because of concern regarding a previous mild esophageal stricture requiring dilatation in January 2016.  I personally reviewed the patient's history. HPI  Past Medical History:  Diagnosis Date  . Blood in stool    with constipation, h/o internal hemorrhoids  . Hyperglycemia   . Special screening for malignant neoplasm of prostate   . Stricture and stenosis of esophagus     Past Surgical History:  Procedure Laterality Date  . COLONOSCOPY  [id number]  . Cystic Hygroma Removal  1961, 1982 [facility]   Left  . ESOPHAGOGASTRODUODENOSCOPY  07/13/2007   B9 stricture, esoph FB (Dr. Servando Snare) - Repeat 3 weeks  . ESOPHAGOGASTRODUODENOSCOPY  08/03/2007   Dilation (Dr. Servando Snare), repeat dilation 2016  . GUM SURGERY  2006  . Lower Eyelid Lesion Removal  02/2006   Left, Dr. Fransico Michael    Family History  Problem Relation Age of Onset  . Hypertension Mother   . Hyperlipidemia Mother   . Cancer Mother     Basal Cell  . Heart disease Other     CAD, one at 41 YOA  . Cancer Maternal Grandmother     Chronic Leukemia  . Diabetes Paternal Grandfather   . Prostate cancer Paternal Uncle     dx'd at advanced age  . Depression Neg Hx   . Alcohol abuse Neg Hx   . Drug abuse Neg Hx   . Stroke Neg Hx   . Colon cancer Neg Hx     Social History Social History  Substance  Use Topics  . Smoking status: Never Smoker  . Smokeless tobacco: Never Used  . Alcohol use Yes     Comment: 2 beers per month    No Known Allergies  Current Outpatient Prescriptions  Medication Sig Dispense Refill  . cyanocobalamin 1000 MCG tablet Take 1,000 mcg by mouth. 2 times a week    . pantoprazole (PROTONIX) 40 MG tablet 1 tab on Sun Mon Thurs (3 days a week)     No current facility-administered medications for this visit.     Review of Systems Review of Systems  Constitutional: Negative.   Respiratory: Negative.   Cardiovascular: Negative.     Blood pressure 134/64, pulse (!) 59, resp. rate 14, height 6\' 1"  (1.854 m), weight 163 lb (73.9 kg).  Physical Exam Physical Exam  Constitutional: He is oriented to person, place, and time. He appears well-developed and well-nourished.  Eyes: Conjunctivae are normal. No scleral icterus.  Neck: Neck supple.  Cardiovascular: Normal rate, regular rhythm and normal heart sounds.   Pulmonary/Chest: Effort normal and breath sounds normal.  Abdominal: Soft. Bowel sounds are normal. There is tenderness (right rib margin). A hernia is present. Hernia confirmed positive in the right inguinal area.  Genitourinary:  Lymphadenopathy:    He has no cervical adenopathy.  Neurological: He is alert and oriented to person, place, and time.  Skin: Skin is warm and dry.    Data Reviewed EGD report of 07/22/2014.  Assessment    Right inguinal hernia, minimally symptomatic.  Right heel pain, likely spur versus plantar fasciitis versus tendinitis.    Plan    The patient will like to defer elective hernia repair until later in the year. This is reasonable remains asymptomatic now 6 months after initial discovery.  A short trial of Aleve, 2 tablets twice day for 10 days has been recommended to see if this improves his heel pain. He should continue his PPI during this time and take medication with meals to minimize any GI side effects.  If he does not improve he would be a good candidate for podiatry assessment.     Hernia precautions and incarceration were discussed with the patient. If they develop symptoms of an incarcerated hernia, they were encouraged to seek prompt medical attention.  I have recommended repair of the hernia using mesh on an outpatient basis in the near future. The risk of infection was reviewed. The role of prosthetic mesh to minimize the risk of recurrence was reviewed.  Patient advised may drive when pain free and restrict activity for the first week post surgery.   Will call the office to schedule surgery when convenient for his schedule.   Take Aleve twice a day for 10 days for the heel pain advised to seek Medical opinion of a podiatrist if it persists.   This information has been scribed by Milas Kocher, CMA.    Earline Mayotte 02/12/2016, 7:45 PM

## 2016-04-07 ENCOUNTER — Telehealth: Payer: Self-pay

## 2016-04-07 NOTE — Telephone Encounter (Signed)
Message left to inform the patient that our December surgery schedule is open if he would like to schedule his surgery.

## 2016-04-09 NOTE — Telephone Encounter (Signed)
The patient called to schedule his surgery. He is scheduled for repair of right inguinal hernia at [facility] on 06/28/16. He reports that his insurance and medications have not changed since he was last seen. He will pre admit by phone. The patient is aware of date and instructions. Surgery instructions have been mailed to the patient.

## 2016-04-16 ENCOUNTER — Other Ambulatory Visit: Payer: Self-pay | Admitting: General Surgery

## 2016-04-16 DIAGNOSIS — K4090 Unilateral inguinal hernia, without obstruction or gangrene, not specified as recurrent: Secondary | ICD-10-CM

## 2016-06-18 ENCOUNTER — Encounter
Admission: RE | Admit: 2016-06-18 | Discharge: 2016-06-18 | Disposition: A | Discharge status: Home / Self Care | Payer: BLUE CROSS/BLUE SHIELD | Source: Ambulatory Visit | Attending: General Surgery | Admitting: General Surgery

## 2016-06-18 HISTORY — DX: Gastro-esophageal reflux disease without esophagitis: K21.9

## 2016-06-18 NOTE — Patient Instructions (Signed)
  Your procedure is scheduled on: 06/28/16 Report to Day Surgery.[facility] SECOND FLOOR To find out your arrival time please call [phone] between 1PM - 3PM on 06/24/16.  Remember: Instructions that are not followed completely may result in serious medical risk, up to and including death, or upon the discretion of your surgeon and anesthesiologist your surgery may need to be rescheduled.    __X__ 1. Do not eat food or drink liquids after midnight. No gum chewing or hard candies.     ____ 2. No Alcohol for 24 hours before or after surgery.   ____ 3. Do Not Smoke For 24 Hours Prior to Your Surgery.   ____ 4. Bring all medications with you on the day of surgery if instructed.    ___X_ 5. Notify your doctor if there is any change in your medical condition     (cold, fever, infections).       Do not wear jewelry, make-up, hairpins, clips or nail polish.  Do not wear lotions, powders, or perfumes. You may wear deodorant.  Do not shave 48 hours prior to surgery. Men may shave face and neck.  Do not bring valuables to the hospital.    [facility] is not responsible for any belongings or valuables.               Contacts, dentures or bridgework may not be worn into surgery.  Leave your suitcase in the car. After surgery it may be brought to your room.  For patients admitted to the hospital, discharge time is determined by your                treatment team.   Patients discharged the day of surgery will not be allowed to drive home.   ___X_ Take these medicines the morning of surgery with A SIP OF WATER:    1. PROTONIX AT BEDTIME 06/27/16 AND AM OF SURGERY  2.   3.   4.  5.  6.  ____ Fleet Enema (as directed)   ____ Use CHG Soap as directed  ____ Use inhalers on the day of surgery  ____ Stop metformin 2 days prior to surgery    ____ Take 1/2 of usual insulin dose the night before surgery and none on the morning of surgery.   ____ Stop Coumadin/Plavix/aspirin on   ____  Stop Anti-inflammatories on    ____ Stop supplements until after surgery.    ____ Bring C-Pap to the hospital.

## 2016-06-23 ENCOUNTER — Telehealth: Payer: Self-pay | Admitting: *Deleted

## 2016-06-23 ENCOUNTER — Encounter: Payer: Self-pay | Admitting: General Surgery

## 2016-06-23 ENCOUNTER — Ambulatory Visit (INDEPENDENT_AMBULATORY_CARE_PROVIDER_SITE_OTHER): Payer: BLUE CROSS/BLUE SHIELD | Admitting: General Surgery

## 2016-06-23 VITALS — BP 120/80 | HR 82 | Temp 96.2°F | Resp 12 | Ht 73.0 in | Wt 161.0 lb

## 2016-06-23 DIAGNOSIS — J040 Acute laryngitis: Secondary | ICD-10-CM

## 2016-06-23 DIAGNOSIS — K4090 Unilateral inguinal hernia, without obstruction or gangrene, not specified as recurrent: Secondary | ICD-10-CM

## 2016-06-23 NOTE — Patient Instructions (Signed)
Return as scheduled 

## 2016-06-23 NOTE — Progress Notes (Signed)
Patient ID: Thomas PatteeRobert M Thom, male   DOB: 1961-07-09, 55 y.o.   MRN: [id number]  Chief Complaint  Patient presents with  . Other    sore throat    HPI Thomas Welch is a 55 y.o. male here today for a evaluation of a sore throat . Patient is scheduled for surgery on 06/28/2016.  Marland Kitchen.HPI  Past Medical History:  Diagnosis Date  . Blood in stool    with constipation, h/o internal hemorrhoids  . GERD (gastroesophageal reflux disease)   . Hyperglycemia   . Special screening for malignant neoplasm of prostate   . Stricture and stenosis of esophagus     Past Surgical History:  Procedure Laterality Date  . COLONOSCOPY  [id number],2016  . Cystic Hygroma Removal  1961, 1982 [facility]   Left  . ESOPHAGOGASTRODUODENOSCOPY  07/13/2007   B9 stricture, esoph FB (Dr. Servando SnareWohl) - Repeat 3 weeks  . ESOPHAGOGASTRODUODENOSCOPY  08/03/2007   Dilation (Dr. Servando SnareWohl), repeat dilation 2016  . GUM SURGERY  2006  . Lower Eyelid Lesion Removal  02/2006   Left, Dr. Fransico MichaelBrennan    Family History  Problem Relation Age of Onset  . Hypertension Mother   . Hyperlipidemia Mother   . Cancer Mother     Basal Cell  . Heart disease Other     CAD, one at 3960 YOA  . Cancer Maternal Grandmother     Chronic Leukemia  . Diabetes Paternal Grandfather   . Prostate cancer Paternal Uncle     dx'd at advanced age  . Depression Neg Hx   . Alcohol abuse Neg Hx   . Drug abuse Neg Hx   . Stroke Neg Hx   . Colon cancer Neg Hx     Social History Social History  Substance Use Topics  . Smoking status: Never Smoker  . Smokeless tobacco: Never Used  . Alcohol use Yes     Comment: 2 beers per month    No Known Allergies  Current Outpatient Prescriptions  Medication Sig Dispense Refill  . cyanocobalamin 1000 MCG tablet Take 1,000 mcg by mouth. 2 times a week    . pantoprazole (PROTONIX) 40 MG tablet 1 tab on Sun Mon Thurs (3 days a week) (Patient taking differently: Take 40 mg by mouth daily as needed (indigestion). )    . VITAMIN E PO  Take 1 tablet by mouth once a week.     No current facility-administered medications for this visit.     Review of Systems Review of Systems  Blood pressure 120/80, pulse 82, temperature (!) 96.2 F (35.7 C), temperature source Oral, resp. rate 12, height 6\' 1"  (1.854 m), weight 161 lb (73 kg).  Physical Exam Physical Exam  Constitutional: He is oriented to person, place, and time. He appears well-developed and well-nourished.  HENT:  Mouth/Throat: Oropharynx is clear and moist and mucous membranes are normal.  No evidence of pharyngitis.  Neck: Neck supple.  Lymphadenopathy:    He has no cervical adenopathy.  Neurological: He is alert and oriented to person, place, and time.  Skin: Skin is warm and dry.       Assessment    Viral URI.    Plan    We'll proceed with planned hernia repair on 06/28/2016.    Follow up as scheduled.  This information has been scribed by Ples SpecterJessica Qualls CMA.    Earline MayotteByrnett, Simrin Vegh W 06/24/2016, 8:11 AM

## 2016-06-23 NOTE — Telephone Encounter (Signed)
The patient will come in for a throat culture and clinical exam. We'll likely be able to proceed with planned hernia surgery.

## 2016-06-23 NOTE — Telephone Encounter (Signed)
He calls to let us know that since Sunday he has had a sore throat and scratchy voice like "laryngitis". Denies fever, chills or headache. Surgery is planned for Monday and wasn't sure about needing an antibiotic before ?

## 2016-06-24 DIAGNOSIS — J040 Acute laryngitis: Secondary | ICD-10-CM | POA: Insufficient documentation

## 2016-06-27 MED ORDER — CEFAZOLIN SODIUM-DEXTROSE 2-4 GM/100ML-% IV SOLN
2.0000 g | INTRAVENOUS | Status: AC
  Administered 2016-06-28: 2 g via INTRAVENOUS

## 2016-06-28 ENCOUNTER — Encounter: Payer: Self-pay | Admitting: *Deleted

## 2016-06-28 ENCOUNTER — Ambulatory Visit
Admission: RE | Admit: 2016-06-28 | Discharge: 2016-06-28 | Disposition: A | Discharge status: Home / Self Care | Payer: BLUE CROSS/BLUE SHIELD | Source: Ambulatory Visit | Attending: General Surgery | Admitting: General Surgery

## 2016-06-28 ENCOUNTER — Ambulatory Visit: Payer: BLUE CROSS/BLUE SHIELD | Admitting: Anesthesiology

## 2016-06-28 ENCOUNTER — Encounter
Admission: RE | Disposition: A | Discharge status: Home / Self Care | Payer: Self-pay | Source: Ambulatory Visit | Attending: General Surgery

## 2016-06-28 DIAGNOSIS — K4090 Unilateral inguinal hernia, without obstruction or gangrene, not specified as recurrent: Secondary | ICD-10-CM | POA: Insufficient documentation

## 2016-06-28 HISTORY — PX: INGUINAL HERNIA REPAIR: SHX194

## 2016-06-28 SURGERY — REPAIR, HERNIA, INGUINAL, ADULT
Anesthesia: General | Laterality: Right | Wound class: Clean

## 2016-06-28 MED ORDER — CEFAZOLIN SODIUM-DEXTROSE 2-4 GM/100ML-% IV SOLN
INTRAVENOUS | Status: AC
  Filled 2016-06-28: qty 100

## 2016-06-28 MED ORDER — DEXAMETHASONE SODIUM PHOSPHATE 10 MG/ML IJ SOLN
INTRAMUSCULAR | Status: DC | PRN
  Administered 2016-06-28: 10 mg via INTRAVENOUS

## 2016-06-28 MED ORDER — ONDANSETRON HCL 4 MG/2ML IJ SOLN
INTRAMUSCULAR | Status: DC | PRN
  Administered 2016-06-28: 4 mg via INTRAVENOUS

## 2016-06-28 MED ORDER — HYDROCODONE-ACETAMINOPHEN 5-325 MG PO TABS: 1.0000 | ORAL_TABLET | ORAL | 0 refills | Status: DC | PRN

## 2016-06-28 MED ORDER — ACETAMINOPHEN 10 MG/ML IV SOLN
INTRAVENOUS | Status: DC | PRN
  Administered 2016-06-28: 1000 mg via INTRAVENOUS

## 2016-06-28 MED ORDER — BUPIVACAINE-EPINEPHRINE (PF) 0.25% -1:200000 IJ SOLN
INTRAMUSCULAR | Status: DC | PRN
  Administered 2016-06-28: 30 mL

## 2016-06-28 MED ORDER — FENTANYL CITRATE (PF) 100 MCG/2ML IJ SOLN
INTRAMUSCULAR | Status: DC | PRN
  Administered 2016-06-28: 100 ug via INTRAVENOUS

## 2016-06-28 MED ORDER — MIDAZOLAM HCL 2 MG/2ML IJ SOLN
INTRAMUSCULAR | Status: DC | PRN
  Administered 2016-06-28: 2 mg via INTRAVENOUS

## 2016-06-28 MED ORDER — ONDANSETRON HCL 4 MG/2ML IJ SOLN: 4.0000 mg | Freq: Once | INTRAMUSCULAR | Status: DC | PRN

## 2016-06-28 MED ORDER — ACETAMINOPHEN 10 MG/ML IV SOLN
INTRAVENOUS | Status: AC
  Filled 2016-06-28: qty 100

## 2016-06-28 MED ORDER — LACTATED RINGERS IV SOLN
INTRAVENOUS | Status: DC
  Administered 2016-06-28: 08:00:00 via INTRAVENOUS

## 2016-06-28 MED ORDER — GLYCOPYRROLATE 0.2 MG/ML IJ SOLN
INTRAMUSCULAR | Status: DC | PRN
  Administered 2016-06-28: 0.2 mg via INTRAVENOUS

## 2016-06-28 MED ORDER — PROPOFOL 10 MG/ML IV BOLUS
INTRAVENOUS | Status: DC | PRN
  Administered 2016-06-28: 200 mg via INTRAVENOUS

## 2016-06-28 MED ORDER — BUPIVACAINE-EPINEPHRINE (PF) 0.25% -1:200000 IJ SOLN
INTRAMUSCULAR | Status: AC
  Filled 2016-06-28: qty 30

## 2016-06-28 MED ORDER — FENTANYL CITRATE (PF) 100 MCG/2ML IJ SOLN: 25.0000 ug | INTRAMUSCULAR | Status: DC | PRN

## 2016-06-28 SURGICAL SUPPLY — 34 items
BLADE SURG 15 STRL SS SAFETY (BLADE) ×3 IMPLANT
CANISTER SUCT 1200ML W/VALVE (MISCELLANEOUS) ×2 IMPLANT
CHLORAPREP W/TINT 26ML (MISCELLANEOUS) ×2 IMPLANT
DECANTER SPIKE VIAL GLASS SM (MISCELLANEOUS) ×2 IMPLANT
DRAIN PENROSE 1/4X12 LTX (DRAIN) ×2 IMPLANT
DRAPE LAPAROTOMY 100X77 ABD (DRAPES) ×2 IMPLANT
DRESSING TELFA 4X3 1S ST N-ADH (GAUZE/BANDAGES/DRESSINGS) ×2 IMPLANT
DRSG TEGADERM 4X4.75 (GAUZE/BANDAGES/DRESSINGS) ×2 IMPLANT
ELECT REM PT RETURN 9FT ADLT (ELECTROSURGICAL) ×2
ELECTRODE REM PT RTRN 9FT ADLT (ELECTROSURGICAL) ×1 IMPLANT
GLOVE BIO SURGEON STRL SZ7.5 (GLOVE) ×4 IMPLANT
GLOVE INDICATOR 8.0 STRL GRN (GLOVE) ×3 IMPLANT
GOWN STRL REUS W/ TWL LRG LVL3 (GOWN DISPOSABLE) ×2 IMPLANT
GOWN STRL REUS W/TWL LRG LVL3 (GOWN DISPOSABLE) ×4
KIT RM TURNOVER STRD PROC AR (KITS) ×2 IMPLANT
LABEL OR SOLS (LABEL) ×2 IMPLANT
MESH HERNIA 6X12 ULTRAPRO MED (Mesh General) ×1 IMPLANT
MESH HERNIA ULTRAPRO MED (Mesh General) ×1 IMPLANT
NDL HYPO 25X1 1.5 SAFETY (NEEDLE) ×1 IMPLANT
NDL SAFETY 22GX1.5 (NEEDLE) ×4 IMPLANT
NEEDLE HYPO 25X1 1.5 SAFETY (NEEDLE) ×2 IMPLANT
PACK BASIN MINOR ARMC (MISCELLANEOUS) ×2 IMPLANT
STRIP CLOSURE SKIN 1/2X4 (GAUZE/BANDAGES/DRESSINGS) ×2 IMPLANT
SUT SURGILON 0 BLK (SUTURE) ×3 IMPLANT
SUT VIC AB 2-0 SH 27 (SUTURE) ×2
SUT VIC AB 2-0 SH 27XBRD (SUTURE) ×1 IMPLANT
SUT VIC AB 3-0 54X BRD REEL (SUTURE) ×1 IMPLANT
SUT VIC AB 3-0 BRD 54 (SUTURE) ×2
SUT VIC AB 3-0 SH 27 (SUTURE) ×2
SUT VIC AB 3-0 SH 27X BRD (SUTURE) ×1 IMPLANT
SUT VIC AB 4-0 FS2 27 (SUTURE) ×2 IMPLANT
SWABSTK COMLB BENZOIN TINCTURE (MISCELLANEOUS) ×2 IMPLANT
SYR 3ML LL SCALE MARK (SYRINGE) ×2 IMPLANT
SYR CONTROL 10ML (SYRINGE) ×4 IMPLANT

## 2016-06-28 NOTE — Discharge Instructions (Signed)

## 2016-06-28 NOTE — Anesthesia Procedure Notes (Signed)
Procedure Name: LMA Insertion Date/Time: 06/28/2016 7:44 AM Performed by: Omer JackWEATHERLY, Thomas Blackburn Pre-anesthesia Checklist: Patient identified, Patient being monitored, Timeout performed, Emergency Drugs available and Suction available Patient Re-evaluated:Patient Re-evaluated prior to inductionOxygen Delivery Method: Circle system utilized Preoxygenation: Pre-oxygenation with 100% oxygen Intubation Type: IV induction Ventilation: Mask ventilation without difficulty LMA: LMA inserted LMA Size: 5.0 Tube type: Oral Number of attempts: 2 Placement Confirmation: positive ETCO2 and breath sounds checked- equal and bilateral Tube secured with: Tape Dental Injury: Teeth and Oropharynx as per pre-operative assessment

## 2016-06-28 NOTE — OR Nursing (Signed)
Dr Maisie Fushomas in to interview pt- dicussed sore throat and cough

## 2016-06-28 NOTE — Transfer of Care (Signed)
Immediate Anesthesia Transfer of Care Note  Patient: Thomas PatteeRobert M Welch  Procedure(s) Performed: Procedure(s): HERNIA REPAIR INGUINAL ADULT (Right)  Patient Location: PACU  Anesthesia Type:General  Level of Consciousness: sedated and responds to stimulation  Airway & Oxygen Therapy: Patient Spontanous Breathing and Patient connected to face mask oxygen  Post-op Assessment: Report given to RN and Post -op Vital signs reviewed and stable  Post vital signs: Reviewed and stable  Last Vitals:  Vitals:   06/28/16 0632 06/28/16 0834  BP: (!) 135/94 (!) 101/57  Pulse: 80 61  Resp: 16 11  Temp: 36.8 C 36.7 C    Last Pain:  Vitals:   06/28/16 0632  TempSrc: Tympanic  PainSc: 3          Complications: No apparent anesthesia complications

## 2016-06-28 NOTE — H&P (Signed)
Mild URI symptoms persist. Rare cough, scant sputum production. No fever or chills.  Lungs: Clear. Cardio: RR.  Plan: Proceed with right inguinal hernia repair.

## 2016-06-28 NOTE — Anesthesia Postprocedure Evaluation (Signed)
Anesthesia Post Note  Patient: Thomas PatteeRobert M Welch  Procedure(s) Performed: Procedure(s) (LRB): HERNIA REPAIR INGUINAL ADULT (Right)  Patient location during evaluation: PACU Anesthesia Type: General Level of consciousness: awake and alert Pain management: pain level controlled Vital Signs Assessment: post-procedure vital signs reviewed and stable Respiratory status: spontaneous breathing, nonlabored ventilation, respiratory function stable and patient connected to nasal cannula oxygen Cardiovascular status: blood pressure returned to baseline and stable Postop Assessment: no signs of nausea or vomiting Anesthetic complications: no    Last Vitals:  Vitals:   06/28/16 0920 06/28/16 1004  BP: 120/69 126/72  Pulse: 63 (!) 50  Resp: 16 16  Temp: 36.6 C     Last Pain:  Vitals:   06/28/16 0920  TempSrc: Oral  PainSc:                  Olufemi Mofield S

## 2016-06-28 NOTE — Anesthesia Preprocedure Evaluation (Addendum)
Anesthesia Evaluation  Patient identified by MRN, date of birth, ID band Patient awake    Reviewed: Allergy & Precautions, NPO status , Patient's Chart, lab work & pertinent test results, reviewed documented beta blocker date and time   Airway Mallampati: II  TM Distance: >3 FB     Dental  (+) Chipped   Pulmonary           Cardiovascular      Neuro/Psych    GI/Hepatic   Endo/Other    Renal/GU      Musculoskeletal   Abdominal   Peds  Hematology   Anesthesia Other Findings Esophageal stensois dilated 2 yrs ago, no problems now. No reflux problems. LMA  Ok. Complains of dry cough, no fever. Feels ok.  Reproductive/Obstetrics                            Anesthesia Physical Anesthesia Plan  ASA: III  Anesthesia Plan: General   Post-op Pain Management:    Induction: Intravenous  Airway Management Planned: LMA  Additional Equipment:   Intra-op Plan:   Post-operative Plan:   Informed Consent: I have reviewed the patients History and Physical, chart, labs and discussed the procedure including the risks, benefits and alternatives for the proposed anesthesia with the patient or authorized representative who has indicated his/her understanding and acceptance.     Plan Discussed with: CRNA  Anesthesia Plan Comments:        Anesthesia Quick Evaluation

## 2016-06-28 NOTE — Op Note (Signed)
Preoperative diagnosis: Right inguinal hernia.  Postoperative diagnosis: Same.  Operative procedure: Right inguinal hernia repair with medium Ultra Pro mesh.  Operating surgeon: Lane HackerJeffery Chelisa Hennen, M.D.  Anesthesia: Gen. by LMA, Marcaine 0.25% with 1-200,000 epinephrine, 30 mL, Toradol 30 mg.  Estimated blood loss: Minimal.  Clinical note: This 55 year old male has a long-standing right inguinal hernia was better for elective repair. He received Kefzol prior the procedure. Hair was removed from the surgical site with clippers prior to presentation to the operating theater.  Operative note: With the patient under adequate general anesthesia and SCD stockings in place for DVT prevention the abdomen was prepped with ChloraPrep and draped. A 4-1/2 cm skin line incision over the anticipated course the inguinal canal was carried down through the skin and subcutis tissue with hemostasis achieved by electrocautery and 3-0 Vicryl ties. The external 1 was opened in direction of its fibers. The ilioinguinal, iliohypogastric and genitofemoral nerve were identified and protected. The cremasteric fibers were divided to allow exposure of a large chronic hernia sac. Mild inflammation. The sac was dissected free and the preperitoneal space. The undersurface of the fascia was cleared and a medium Ultra Pro mesh was smoothed into position. The external component was laid along for the inguinal canal and anchored to the pubic tubercle with 0 Surgilon suture. The inferior aspect of the mesh was anchored to the inguinal ligament with interrupted 0 Surgilon sutures. The medial and superior borders were anchored to the transverse abdominis aponeurosis in a similar fashion. A lateral slit was made for cord passage. Toradol was placed in the wound. Field block anesthesia was completed at the beginning of the procedure. The external oblique was closed with a running 2-0 Vicryl. Scarpa's fascia was closed with a running 3-0 Vicryl  suture and the skin closed with a running 4-0 Vicryl septic suture. Benzoin, Steri-Strips, Telfa and Tegaderm dressing was applied.  The patient tolerated the procedure well and was taken to recovery in stable condition.

## 2016-07-05 ENCOUNTER — Encounter: Payer: Self-pay | Admitting: General Surgery

## 2016-07-05 ENCOUNTER — Ambulatory Visit (INDEPENDENT_AMBULATORY_CARE_PROVIDER_SITE_OTHER): Payer: BLUE CROSS/BLUE SHIELD | Admitting: General Surgery

## 2016-07-05 VITALS — BP 134/68 | HR 62 | Resp 12 | Ht 72.0 in | Wt 160.0 lb

## 2016-07-05 DIAGNOSIS — K4090 Unilateral inguinal hernia, without obstruction or gangrene, not specified as recurrent: Secondary | ICD-10-CM

## 2016-07-05 NOTE — Patient Instructions (Signed)
Return as needed

## 2016-07-05 NOTE — Progress Notes (Signed)
Patient ID: Thomas Welch, male   DOB: 01/01/1961, 55 y.o.   MRN: [id number]  Chief Complaint  Patient presents with  . Routine Post Op    inguinal hernia     HPI Thomas PatteeRobert M Welch is a 55 y.o. male here today for his post op inguinal hernia repair done on 06/28/2016. Patient states he is doing well and moving bowels. HPI  Past Medical History:  Diagnosis Date  . Blood in stool    with constipation, h/o internal hemorrhoids  . GERD (gastroesophageal reflux disease)   . Hyperglycemia   . Special screening for malignant neoplasm of prostate   . Stricture and stenosis of esophagus     Past Surgical History:  Procedure Laterality Date  . COLONOSCOPY  [id number],2016  . Cystic Hygroma Removal  1961, 1982 [facility]   Left  . ESOPHAGOGASTRODUODENOSCOPY  07/13/2007   B9 stricture, esoph FB (Dr. Servando SnareWohl) - Repeat 3 weeks  . ESOPHAGOGASTRODUODENOSCOPY  08/03/2007   Dilation (Dr. Servando SnareWohl), repeat dilation 2016  . GUM SURGERY  2006  . INGUINAL HERNIA REPAIR Right 06/28/2016   HERNIA REPAIR, RIGHT; Medium Ultrapro Mesh. Surgeon: Earline MayotteJeffrey W Ardis Lawley, MD;  Location: [facility] ORS;  Service: General;  Laterality: Right;  . Lower Eyelid Lesion Removal  02/2006   Left, Dr. Fransico MichaelBrennan    Family History  Problem Relation Age of Onset  . Hypertension Mother   . Hyperlipidemia Mother   . Cancer Mother     Basal Cell  . Heart disease Other     CAD, one at 5860 YOA  . Cancer Maternal Grandmother     Chronic Leukemia  . Diabetes Paternal Grandfather   . Prostate cancer Paternal Uncle     dx'd at advanced age  . Depression Neg Hx   . Alcohol abuse Neg Hx   . Drug abuse Neg Hx   . Stroke Neg Hx   . Colon cancer Neg Hx     Social History Social History  Substance Use Topics  . Smoking status: Never Smoker  . Smokeless tobacco: Never Used  . Alcohol use Yes     Comment: 2 beers per month    No Known Allergies  Current Outpatient Prescriptions  Medication Sig Dispense Refill  . cyanocobalamin 1000 MCG tablet  Take 1,000 mcg by mouth. 2 times a week    . pantoprazole (PROTONIX) 40 MG tablet 1 tab on Sun Mon Thurs (3 days a week) (Patient taking differently: Take 40 mg by mouth daily as needed (indigestion). )    . VITAMIN E PO Take 1 tablet by mouth once a week.     No current facility-administered medications for this visit.     Review of Systems Review of Systems  Constitutional: Negative.   Respiratory: Negative.   Cardiovascular: Negative.     Blood pressure 134/68, pulse 62, resp. rate 12, height 6' (1.829 m), weight 160 lb (72.6 kg).  Physical Exam Physical Exam  Constitutional: He is oriented to person, place, and time. He appears well-developed and well-nourished.  Abdominal:  Right inguinal hernia repair is intact and healing well.   Genitourinary:     Neurological: He is alert and oriented to person, place, and time.  Skin: Skin is warm and dry.       Assessment    Doing well status post right inguinal hernia repair.    Plan    Upper lifting techniques reviewed.  He'll gradually resume his exercise regimen as he is comfortable, but we'll avoid  abdominal "crunches" for the next few weeks.    Return as needed. This information has been scribed by Ples SpecterJessica Qualls CMA.   Earline MayotteByrnett, Aliene Tamura W 07/05/2016, 9:23 PM

## 2016-08-16 ENCOUNTER — Telehealth: Payer: Self-pay

## 2016-08-16 NOTE — Telephone Encounter (Signed)
PLEASE NOTE: All timestamps contained within this report are represented as [place] Standard Time. CONFIDENTIALTY NOTICE: This fax transmission is intended only for the addressee. It contains information that is legally privileged, confidential or otherwise protected from use or disclosure. If you are not the intended recipient, you are strictly prohibited from reviewing, disclosing, copying using or disseminating any of this information or taking any action in reliance on or regarding this information. If you have received this fax in error, please notify us immediately by telephone so that we can arrange for its return to us. Phone: [phone], Toll-Free: [phone], Fax: [phone] Page: 1 of 2 Call Id: [id number] [facility] Client TELEPHONE ADVICE RECORD [facility] Patient Name: Thomas MaduroROBERT Aikens Gender: Male DOB: Aug 09, 1960 Age: 56 Y 29 D Return Phone Number: [phone] (Primary) City/State/Zip: [place] Client [facility] - Client Client Site [facility] Who Is Calling Patient / Member / Family / Caregiver Call Type Triage / Clinical Caller Name Lucendia HerrlichRobert (Bert) Relationship To Patient Self Return Phone Number [phone] (Primary) Chief Complaint Mouth Symptoms Reason for Call Symptomatic / Request for Health Information Initial Comment Caller states that for 2 weeks or so one side of his face it hurts when he bites down certain ways, but over the last week it is tender to touch with a little below his eye bone. Appointment Disposition EMR Appointment Attempted - Not Scheduled Info pasted into Epic No Nurse Assessment Nurse: Lucianne LeiGreenawalt, RN, Lanora ManisElizabeth Date/Time (Eastern Time): 08/16/2016 4:20:56 PM Confirm and document reason for call. If symptomatic, describe symptoms. ---Patient states he has pain and swelling on the left side of his face for 2 weeks. No other symptoms Does the  PT have any chronic conditions? (i.e. diabetes, asthma, etc.) ---Yes List chronic conditions. ---GERD Guidelines Guideline Title Affirmed Question Face Pain Swelling around the eye Disp. Time Lamount Cohen(Eastern Time) Disposition Final User 08/16/2016 4:27:54 PM See Physician within 4 Hours (or PCP triage) Yes Greenawalt, RN, Lanora ManisElizabeth Referrals [facility] Care Advice Given Per Guideline SEE PHYSICIAN WITHIN 4 HOURS (or PCP triage): * IF OFFICE WILL BE CLOSED AND NO PCP TRIAGE: You need to be seen within the next 3 or 4 hours. A nearby Urgent Care Center is often a good source of care. Another choice is to go to the ER. Go sooner if you become worse. * IF OFFICE WILL BE CLOSED AND PCP TRIAGE REQUIRED: You may need to be seen. Your doctor will want to talk with you to decide what's best. I'll page the doctor now. If you haven't heard from the on-call doctor within 30 minutes, call again. NOTE: If PCP can't be reached, [facility] or [facility]. CALL BACK IF: * You become worse. PLEASE NOTE: All timestamps contained within this report are represented as [place] Standard Time. CONFIDENTIALTY NOTICE: This fax transmission is intended only for the addressee. It contains information that is legally privileged, confidential or otherwise protected from use or disclosure. If you are not the intended recipient, you are strictly prohibited from reviewing, disclosing, copying using or disseminating any of this information or taking any action in reliance on or regarding this information. If you have received this fax in error, please notify us immediately by telephone so that we can arrange for its return to us. Phone: [phone], Toll-Free: [phone], Fax: [phone] Page: 2 of 2 Call Id: [id number]  Comments User: Larrie Kass, RN Date/Time Lamount Cohen Time): 08/16/2016 4:28:24 PM Patient states he does not take Tylenol or Ibuprofen.

## 2016-08-16 NOTE — Telephone Encounter (Signed)
Please get update. See when patient can come in to get checked. Thanks.

## 2016-08-17 ENCOUNTER — Encounter: Payer: Self-pay | Admitting: Family Medicine

## 2016-08-17 ENCOUNTER — Ambulatory Visit (INDEPENDENT_AMBULATORY_CARE_PROVIDER_SITE_OTHER): Payer: BLUE CROSS/BLUE SHIELD | Admitting: Family Medicine

## 2016-08-17 DIAGNOSIS — R51 Headache: Secondary | ICD-10-CM | POA: Diagnosis not present

## 2016-08-17 DIAGNOSIS — R519 Headache, unspecified: Secondary | ICD-10-CM

## 2016-08-17 MED ORDER — AMOXICILLIN-POT CLAVULANATE 875-125 MG PO TABS: 1.0000 | ORAL_TABLET | Freq: Two times a day (BID) | ORAL | 0 refills | Status: AC

## 2016-08-17 NOTE — Patient Instructions (Signed)
Great to see you.  Please take Augmentin as directed- 1 tablet twice daily for 10 days.  Keep me updated.

## 2016-08-17 NOTE — Progress Notes (Signed)
Pre visit review using our clinic review tool, if applicable. No additional management support is needed unless otherwise documented below in the visit note. 

## 2016-08-17 NOTE — Progress Notes (Signed)
SUBJECTIVE:  Thomas Welch is a 56 y.o. male pt of Dr. Para Marchuncan, new to me, who complains of left sided ear and left facial pain for past several days. Did have a URI with cough and congestion a few weeks ago. No fever.  No CP, No SOB.  No nasal congestion currently.  Also complicated by his history of TMJ.  Often has similar ear pain with TMJ but never facial pain.  Current Outpatient Prescriptions on File Prior to Visit  Medication Sig Dispense Refill  . cyanocobalamin 1000 MCG tablet Take 1,000 mcg by mouth. 2 times a week    . pantoprazole (PROTONIX) 40 MG tablet 1 tab on Sun Mon Thurs (3 days a week) (Patient taking differently: Take 40 mg by mouth daily as needed (indigestion). )    . VITAMIN E PO Take 1 tablet by mouth once a week.     No current facility-administered medications on file prior to visit.     No Known Allergies  Past Medical History:  Diagnosis Date  . Blood in stool    with constipation, h/o internal hemorrhoids  . GERD (gastroesophageal reflux disease)   . Hyperglycemia   . Special screening for malignant neoplasm of prostate   . Stricture and stenosis of esophagus     Past Surgical History:  Procedure Laterality Date  . COLONOSCOPY  [id number],2016  . Cystic Hygroma Removal  1961, 1982 [facility]   Left  . ESOPHAGOGASTRODUODENOSCOPY  07/13/2007   B9 stricture, esoph FB (Dr. Servando SnareWohl) - Repeat 3 weeks  . ESOPHAGOGASTRODUODENOSCOPY  08/03/2007   Dilation (Dr. Servando SnareWohl), repeat dilation 2016  . GUM SURGERY  2006  . INGUINAL HERNIA REPAIR Right 06/28/2016   HERNIA REPAIR, RIGHT; Medium Ultrapro Mesh. Surgeon: Earline MayotteJeffrey W Byrnett, MD;  Location: [facility] ORS;  Service: General;  Laterality: Right;  . Lower Eyelid Lesion Removal  02/2006   Left, Dr. Fransico MichaelBrennan    Family History  Problem Relation Age of Onset  . Hypertension Mother   . Hyperlipidemia Mother   . Cancer Mother     Basal Cell  . Heart disease Other     CAD, one at 6660 YOA  . Cancer Maternal Grandmother    Chronic Leukemia  . Diabetes Paternal Grandfather   . Prostate cancer Paternal Uncle     dx'd at advanced age  . Depression Neg Hx   . Alcohol abuse Neg Hx   . Drug abuse Neg Hx   . Stroke Neg Hx   . Colon cancer Neg Hx     Social History   Social History  . Marital status: Married    Spouse name: N/A  . Number of children: 2  . Years of education: N/A   Occupational History  . [employer], [employer] [place]   Social History Main Topics  . Smoking status: Never Smoker  . Smokeless tobacco: Never Used  . Alcohol use Yes     Comment: 2 beers per month  . Drug use: No  . Sexual activity: Not on file   Other Topics Concern  . Not on file   Social History Narrative   Married, 1985, lives with wife   2 children, both out of the home   [employer] grad 1983   Enjoys tennis, swimming, running   Works at [employer]   The PMH, PSH, Social History, Family History, Medications, and allergies have been reviewed in [facility], and have been updated if relevant.  OBJECTIVE: BP 104/68   Pulse 65  Temp 98.2 F (36.8 C) (Oral)   Wt 158 lb 8 oz (71.9 kg)   SpO2 97%   BMI 21.50 kg/m   He appears well, vital signs are as noted. Ears normal.  Throat and pharynx normal.  Neck supple. No adenopathy in the neck. Nose is congested. Left maxillary sinus TTP, no jaw click. The chest is clear, without wheezes or rales.

## 2016-08-17 NOTE — Assessment & Plan Note (Signed)
New- unclear if this is an acute sinus infection that is also complicated by some TMJ symptoms. Given duration and progression of symptoms, will treat for bacterial sinusitis with Augmentin. Advised to wear his mouth guard. Call or return to clinic prn if these symptoms worsen or fail to improve as anticipated. The patient indicates understanding of these issues and agrees with the plan.

## 2016-08-17 NOTE — Telephone Encounter (Signed)
Thanks

## 2016-08-17 NOTE — Telephone Encounter (Signed)
Spoke to patient and was advised that she saw Dr. Dayton MartesAron this morning and everything is good.

## 2016-11-01 ENCOUNTER — Other Ambulatory Visit: Payer: Self-pay | Admitting: Family Medicine

## 2016-11-01 DIAGNOSIS — Z8639 Personal history of other endocrine, nutritional and metabolic disease: Secondary | ICD-10-CM

## 2016-11-01 DIAGNOSIS — Z125 Encounter for screening for malignant neoplasm of prostate: Secondary | ICD-10-CM

## 2016-11-01 DIAGNOSIS — Z1159 Encounter for screening for other viral diseases: Secondary | ICD-10-CM

## 2016-11-09 ENCOUNTER — Other Ambulatory Visit (INDEPENDENT_AMBULATORY_CARE_PROVIDER_SITE_OTHER): Payer: BLUE CROSS/BLUE SHIELD

## 2016-11-09 DIAGNOSIS — Z119 Encounter for screening for infectious and parasitic diseases, unspecified: Secondary | ICD-10-CM

## 2016-11-09 DIAGNOSIS — Z8639 Personal history of other endocrine, nutritional and metabolic disease: Secondary | ICD-10-CM | POA: Diagnosis not present

## 2016-11-09 DIAGNOSIS — Z125 Encounter for screening for malignant neoplasm of prostate: Secondary | ICD-10-CM | POA: Diagnosis not present

## 2016-11-09 DIAGNOSIS — Z1159 Encounter for screening for other viral diseases: Secondary | ICD-10-CM

## 2016-11-09 LAB — COMPREHENSIVE METABOLIC PANEL
ALT: 14 U/L (ref 0–53)
AST: 25 U/L (ref 0–37)
Albumin: 4.3 g/dL (ref 3.5–5.2)
Alkaline Phosphatase: 63 U/L (ref 39–117)
BUN: 17 mg/dL (ref 6–23)
CHLORIDE: 103 meq/L (ref 96–112)
CO2: 30 mEq/L (ref 19–32)
CREATININE: 1.07 mg/dL (ref 0.40–1.50)
Calcium: 9.7 mg/dL (ref 8.4–10.5)
GFR: 75.63 mL/min (ref >60.00)
Glucose, Bld: 101 mg/dL — ABNORMAL HIGH (ref 70–99)
Potassium: 4.6 mEq/L (ref 3.5–5.1)
Sodium: 139 mEq/L (ref 135–145)
Total Bilirubin: 0.9 mg/dL (ref 0.2–1.2)
Total Protein: 6.7 g/dL (ref 6.0–8.3)

## 2016-11-09 LAB — LIPID PANEL
CHOLESTEROL: 168 mg/dL (ref 0–200)
HDL: 63.5 mg/dL (ref >39.00)
LDL CALC: 96 mg/dL (ref 0–99)
NonHDL: 104.9
Total CHOL/HDL Ratio: 3
Triglycerides: 44 mg/dL (ref 0.0–149.0)
VLDL: 8.8 mg/dL (ref 0.0–40.0)

## 2016-11-09 LAB — PSA: PSA: 0.58 ng/mL (ref 0.10–4.00)

## 2016-11-10 LAB — HEPATITIS C ANTIBODY: HCV Ab: NEGATIVE

## 2016-11-10 LAB — HIV ANTIBODY (ROUTINE TESTING W REFLEX): HIV: NONREACTIVE

## 2016-11-12 ENCOUNTER — Ambulatory Visit (INDEPENDENT_AMBULATORY_CARE_PROVIDER_SITE_OTHER): Payer: BLUE CROSS/BLUE SHIELD | Admitting: Family Medicine

## 2016-11-12 ENCOUNTER — Encounter: Payer: Self-pay | Admitting: Family Medicine

## 2016-11-12 VITALS — BP 102/74 | HR 54 | Temp 97.9°F | Ht 72.0 in | Wt 159.5 lb

## 2016-11-12 DIAGNOSIS — Z0000 Encounter for general adult medical examination without abnormal findings: Secondary | ICD-10-CM

## 2016-11-12 DIAGNOSIS — K222 Esophageal obstruction: Secondary | ICD-10-CM

## 2016-11-12 NOTE — Assessment & Plan Note (Signed)
Hx of dilation with EGD. No dysphagia on lower dose of PPI, able to tolerate med.  Doing well, continue as is.

## 2016-11-12 NOTE — Progress Notes (Signed)
Pre visit review using our clinic review tool, if applicable. No additional management support is needed unless otherwise documented below in the visit note. 

## 2016-11-12 NOTE — Patient Instructions (Signed)
Take care.  Glad to see you. Update me as needed.  

## 2016-11-12 NOTE — Assessment & Plan Note (Signed)
Tetanus 2009 Flu shot encouraged  Colonoscopy done 2010 PSA wnl.  Diet and exercise- doing well on both.  Hyperglycemia is mild, he is working on diet. Labs d/w pt.  Living will d/w pt. Wife designated if patient were incapacitated.  HIV and HCV neg.

## 2016-11-12 NOTE — Progress Notes (Signed)
CPE- See plan.  Routine anticipatory guidance given to patient.  See health maintenance.  The possibility exists that previously documented standard health maintenance information may have been brought forward from a previous encounter into this note.  If needed, that same information has been updated to reflect the current situation based on today's encounter.    Tetanus 2009 Flu shot encouraged  Colonoscopy done 2010 PSA wnl.  Diet and exercise- doing well on both.  Hyperglycemia is mild, he is working on diet. Labs d/w pt.  Living will d/w pt. Wife designated if patient were incapacitated.  HIV and HCV neg.    L facial pain and swelling resolved after last OV.    He prev had some plantar fascia sx that resolved with stretching, d/w pt.  No sx currently.   No dysphagia on lower dose of PPI, able to tolerate med.   No troubles now with exercise after hernia repair.    PMH and SH reviewed  Meds, vitals, and allergies reviewed.   ROS: Per HPI.  Unless specifically indicated otherwise in HPI, the patient denies:  General: fever. Eyes: acute vision changes ENT: sore throat Cardiovascular: chest pain Respiratory: SOB GI: vomiting GU: dysuria Musculoskeletal: acute back pain Derm: acute rash Neuro: acute motor dysfunction Psych: worsening mood Endocrine: polydipsia Heme: bleeding Allergy: hayfever  GEN: nad, alert and oriented HEENT: mucous membranes moist NECK: supple w/o LA CV: rrr. PULM: ctab, no inc wob ABD: soft, +bs EXT: no edema SKIN: no acute rash

## 2016-12-27 ENCOUNTER — Other Ambulatory Visit: Payer: Self-pay | Admitting: Gastroenterology

## 2016-12-27 ENCOUNTER — Telehealth: Payer: Self-pay

## 2016-12-27 DIAGNOSIS — K219 Gastro-esophageal reflux disease without esophagitis: Secondary | ICD-10-CM

## 2016-12-27 MED ORDER — PANTOPRAZOLE SODIUM 40 MG PO TBEC: 40.0000 mg | DELAYED_RELEASE_TABLET | Freq: Every day | ORAL | 6 refills | Status: DC

## 2016-12-27 NOTE — Telephone Encounter (Signed)
Patient advised.

## 2016-12-27 NOTE — Telephone Encounter (Addendum)
Pt left v/m Dr Servando SnareWohl refused to refill pantoprazole; pt seen 11/12/16 and advised to continue as is. Left v/m for pt to cb;need instructions of how pt taking med at present[facility] Pt called back and on med list has take pantoprazole 40 mg couple times a week. At that time pt was trying to wean himself off med but pt thinks he does need to take daily. Pt request new rx pantoprazole 40 mg taking one tab daily. Pt seen 11/12/16.Please advise.

## 2016-12-27 NOTE — Telephone Encounter (Signed)
plz notify patient - will refill pantoprazole 40mg  daily - #30 RF 6, with PCP to continue refilling afterwards.

## 2017-06-03 ENCOUNTER — Ambulatory Visit (INDEPENDENT_AMBULATORY_CARE_PROVIDER_SITE_OTHER): Payer: BLUE CROSS/BLUE SHIELD | Admitting: Family Medicine

## 2017-06-03 ENCOUNTER — Encounter: Payer: Self-pay | Admitting: Family Medicine

## 2017-06-03 DIAGNOSIS — K219 Gastro-esophageal reflux disease without esophagitis: Secondary | ICD-10-CM | POA: Diagnosis not present

## 2017-06-03 MED ORDER — PANTOPRAZOLE SODIUM 40 MG PO TBEC: DELAYED_RELEASE_TABLET | ORAL | Status: DC

## 2017-06-03 MED ORDER — RANITIDINE HCL 150 MG PO TABS: 75.0000 mg | ORAL_TABLET | Freq: Two times a day (BID) | ORAL | Status: DC | PRN

## 2017-06-03 NOTE — Patient Instructions (Signed)
You can try to wean down off the pantoprazole and replace with as needed zantac 75-150mg  twice a day as needed.   Take care.  Glad to see you.  Update me as needed.

## 2017-06-03 NOTE — Progress Notes (Signed)
His daughter got married recently.    abd pain.  Started about 2-3 weeks ago.  More trouble burping but then would feel better thereafter.  Intermittent discomfort in midline just above the umbilicus.  He started taking zantac 150mg  in the meantime.  Then started to feel better in the meantime. He feels good today.  No vomiting.  No blood in stool.  No black stools.  No fevers.  No rash.  No trauma.    He had tried to cut out PPI, was down to 1-2 doses per week.    He occ has some muscular abd Chait irritation but this other sx was clearly different.   No clear trigger identified.    PMH and SH reviewed  ROS: Per HPI unless specifically indicated in ROS section   Meds, vitals, and allergies reviewed.   GEN: nad, alert and oriented HEENT: mucous membranes moist NECK: supple w/o LA CV: rrr.  PULM: ctab, no inc wob ABD: soft, +bs, not ttp EXT: no edema

## 2017-06-05 DIAGNOSIS — K219 Gastro-esophageal reflux disease without esophagitis: Secondary | ICD-10-CM | POA: Insufficient documentation

## 2017-06-05 NOTE — Assessment & Plan Note (Signed)
Likely the issue.  Benign exam.  D/w pt about options.  He can try to wean down off the pantoprazole and replace with as needed zantac 75-150mg  twice a day as needed.   Update me as needed.  He agrees.

## 2017-06-17 ENCOUNTER — Telehealth: Payer: Self-pay

## 2017-06-17 NOTE — Telephone Encounter (Signed)
Please get some extra info-  1. How much zantac did he take in the last few days?   2. How much protonix did he take in the last few days?   3. Did either help?   4. Is this similar to the pain he have prev?   Let me know.  Thanks.

## 2017-06-17 NOTE — Telephone Encounter (Signed)
D/w pt.  Sx are intermittent, generally above the umbilicus.  No red flag sx (no blood in stool, no black stools, no severe pain, no vomiting). Still could be gerd related.  Would try BID zantac for a few days and update me.  He agrees.  Routine cautions given re: possible worsening sx in the meantime.  He agrees.

## 2017-06-17 NOTE — Telephone Encounter (Signed)
Copied from CRM [id number]. Topic: General - Other >> Jun 17, 2017 11:29 AM Percival SpanishKennedy, Cheryl W wrote: Reason for CRM:  pt call to say he saw the docotr a couple weeks ago with stomach issues and now they are back and is asking for a call back    [phone]  >> Jun 17, 2017 12:03 PM Patience MuscaIsley, Rena M, LPN wrote: I spoke with pt and pt was seen on 06/03/17; pt does not think this is indigestion type pain. Intermittent lower abd pain across abd below belly button; at worst point pain level was 6; pt having normal small BMs about 3 times a day; no constipated or loose stools. Pt does not want med sent to pharmacy but wants to know what to do to fix this problem. Pt has taken Zantac 150 mg today[facility]; pt request cb.

## 2017-06-17 NOTE — Telephone Encounter (Signed)
Has taken Zantac 150 mg. Once daily.  Stopped Protonix over the weekend.  Patient thought Zantac helped a little in the beginning but not for long.  No help with the Protonix.  Pain is similar to previously but seems to move around, circling his belly button and it goes and comes, almost like gas pain but he has had regular BM's.

## 2017-06-20 ENCOUNTER — Telehealth: Payer: Self-pay | Admitting: Family Medicine

## 2017-06-20 NOTE — Telephone Encounter (Signed)
I would give this a little longer as is, since he is some better.  Or he could still add back the protonix 1/2 to 1 tab a day (in addition to the zantac).  If that doesn't help then likely need to see GI.  Let me know how he does.  Thanks.

## 2017-06-20 NOTE — Telephone Encounter (Signed)
Copied from CRM [id number]. Topic: Quick Communication - See Telephone Encounter >> Jun 20, 2017  3:31 PM Louie BunPalacios Medina, Rosey Batheresa D wrote: CRM for notification. See Telephone encounter for: 06/20/17. Patient called and wanted to let Dr. Para Marchuncan know that his stomach/abdomin is some better but not perfect. There is still discomforts and pressure but not like it was last week. Please call patient back, thanks.

## 2017-06-20 NOTE — Telephone Encounter (Signed)
Patient advised.

## 2017-06-24 DIAGNOSIS — D225 Melanocytic nevi of trunk: Secondary | ICD-10-CM | POA: Diagnosis not present

## 2017-06-27 ENCOUNTER — Other Ambulatory Visit: Payer: Self-pay | Admitting: Family Medicine

## 2017-06-28 ENCOUNTER — Telehealth: Payer: Self-pay | Admitting: Gastroenterology

## 2017-06-28 NOTE — Telephone Encounter (Signed)
Patient would like to talk to you regarding a problem. It has been a couple of years since he saw Dr. Servando SnareWohl. Please call

## 2017-06-29 ENCOUNTER — Telehealth: Payer: Self-pay

## 2017-06-29 NOTE — Telephone Encounter (Signed)
Pt was seen 06/03/17 pt was seen with stomach issues; pt continued with same symptoms; pt continued to have pain around belly button, then could have pain on one side of stomach that would go to other side of stomach; burping would relieve pain for short period; also when pt would have BM 2 -3 times per day stool would be large and formed and then would go to much smaller stools. No constipation or diarrhea;no bulging areas or signs of hernia; when laying down pt would occasionally have some pain around belly button; no fever and today no pain at all. Pt thinks it is weird to have consistent pain that just stops. Pt request cb.

## 2017-06-29 NOTE — Telephone Encounter (Signed)
Copied from CRM [phone]. Topic: General - Other >> Jun 28, 2017 11:08 AM Viviann SpareWhite, Selina wrote: Reason for CRM: Pt called requesting a call back from Dr. Para Marchuncan about his on going stomach issues. Pt call back number [phone].

## 2017-06-29 NOTE — Telephone Encounter (Signed)
At 06/03/17 OV, you encouraged pt to wean off of pantoprazole and use Zantac PRN.  Do you want to refill this rx?

## 2017-06-30 NOTE — Telephone Encounter (Signed)
I would get back on the protonix 1 tab daily and I think we should get him over to GI.  Please let me know if he needs a referral.  Thanks.

## 2017-06-30 NOTE — Telephone Encounter (Signed)
Sent. See phone note.  Thanks.

## 2017-06-30 NOTE — Telephone Encounter (Signed)
Patient advised. Patient says he is established with Dr. Servando SnareWohl (GI) and will call for an appointment.  Patient advised that if he has any problems with the appointment, let us know.

## 2017-06-30 NOTE — Telephone Encounter (Signed)
Patient called back still waiting for you to call him. Please call him today

## 2017-07-01 NOTE — Telephone Encounter (Signed)
Spoke with pt regarding some fullness and belching issues. Advised pt to start a daily Miralax and probiotic for about a week or two. If no improvement in his symptoms, he should call back and schedule a follow up with Dr. Servando SnareWohl to discuss further.

## 2017-10-31 ENCOUNTER — Other Ambulatory Visit: Payer: Self-pay | Admitting: Family Medicine

## 2017-10-31 DIAGNOSIS — Z8639 Personal history of other endocrine, nutritional and metabolic disease: Secondary | ICD-10-CM

## 2017-10-31 DIAGNOSIS — Z125 Encounter for screening for malignant neoplasm of prostate: Secondary | ICD-10-CM

## 2017-11-03 ENCOUNTER — Other Ambulatory Visit (INDEPENDENT_AMBULATORY_CARE_PROVIDER_SITE_OTHER): Payer: BLUE CROSS/BLUE SHIELD

## 2017-11-03 DIAGNOSIS — Z8639 Personal history of other endocrine, nutritional and metabolic disease: Secondary | ICD-10-CM

## 2017-11-03 DIAGNOSIS — Z125 Encounter for screening for malignant neoplasm of prostate: Secondary | ICD-10-CM | POA: Diagnosis not present

## 2017-11-03 LAB — LIPID PANEL
CHOL/HDL RATIO: 3
Cholesterol: 157 mg/dL (ref 0–200)
HDL: 59.8 mg/dL (ref >39.00)
LDL CALC: 91 mg/dL (ref 0–99)
NONHDL: 97.56
Triglycerides: 34 mg/dL (ref 0.0–149.0)
VLDL: 6.8 mg/dL (ref 0.0–40.0)

## 2017-11-03 LAB — COMPREHENSIVE METABOLIC PANEL
ALT: 14 U/L (ref 0–53)
AST: 24 U/L (ref 0–37)
Albumin: 4.3 g/dL (ref 3.5–5.2)
Alkaline Phosphatase: 66 U/L (ref 39–117)
BILIRUBIN TOTAL: 0.8 mg/dL (ref 0.2–1.2)
BUN: 17 mg/dL (ref 6–23)
CO2: 31 meq/L (ref 19–32)
CREATININE: 1.06 mg/dL (ref 0.40–1.50)
Calcium: 9.5 mg/dL (ref 8.4–10.5)
Chloride: 104 mEq/L (ref 96–112)
GFR: 76.19 mL/min (ref >60.00)
Glucose, Bld: 101 mg/dL — ABNORMAL HIGH (ref 70–99)
Potassium: 4.5 mEq/L (ref 3.5–5.1)
SODIUM: 139 meq/L (ref 135–145)
TOTAL PROTEIN: 6.6 g/dL (ref 6.0–8.3)

## 2017-11-03 LAB — PSA: PSA: 0.5 ng/mL (ref 0.10–4.00)

## 2017-11-04 ENCOUNTER — Other Ambulatory Visit: Payer: BLUE CROSS/BLUE SHIELD

## 2017-11-11 ENCOUNTER — Other Ambulatory Visit: Payer: BLUE CROSS/BLUE SHIELD

## 2017-11-15 ENCOUNTER — Encounter: Payer: Self-pay | Admitting: Family Medicine

## 2017-11-15 ENCOUNTER — Ambulatory Visit (INDEPENDENT_AMBULATORY_CARE_PROVIDER_SITE_OTHER): Payer: BLUE CROSS/BLUE SHIELD | Admitting: Family Medicine

## 2017-11-15 VITALS — BP 102/62 | HR 57 | Temp 98.5°F | Ht 72.0 in | Wt 158.0 lb

## 2017-11-15 DIAGNOSIS — Z8042 Family history of malignant neoplasm of prostate: Secondary | ICD-10-CM

## 2017-11-15 DIAGNOSIS — Z0000 Encounter for general adult medical examination without abnormal findings: Secondary | ICD-10-CM | POA: Diagnosis not present

## 2017-11-15 DIAGNOSIS — K219 Gastro-esophageal reflux disease without esophagitis: Secondary | ICD-10-CM

## 2017-11-15 DIAGNOSIS — Z23 Encounter for immunization: Secondary | ICD-10-CM

## 2017-11-15 DIAGNOSIS — Z7189 Other specified counseling: Secondary | ICD-10-CM

## 2017-11-15 DIAGNOSIS — Z8639 Personal history of other endocrine, nutritional and metabolic disease: Secondary | ICD-10-CM

## 2017-11-15 MED ORDER — PANTOPRAZOLE SODIUM 40 MG PO TBEC
DELAYED_RELEASE_TABLET | ORAL | 3 refills | Status: DC
Start: 2017-11-15 — End: 2018-08-08

## 2017-11-15 NOTE — Progress Notes (Signed)
CPE- See plan.  Routine anticipatory guidance given to patient.  See health maintenance.  The possibility exists that previously documented standard health maintenance information may have been brought forward from a previous encounter into this note.  If needed, that same information has been updated to reflect the current situation based on today's encounter.    Tetanus 2019 Flu shot encouraged Colonoscopy done 2010 PSA wnl.  Diet and exercise- doing well on both.  Labs d/w pt.  Living will d/w pt. Son designated if patient were incapacitated.  HIV and HCV neg prev.    ASCVD risk 3.4%, labs d/w pt.   He has been taking protonix a few days a week.  His abd sx resolved on probiotics.  Rare use of zantac.  No dysphagia with prev esophageal dilation.    Occ twinges after hernia repair but no constant pain.  No bulge felt by patient.     Spot on chest had resolved, he wanted checked.  Exam done today an no sig findings.    PMH and SH reviewed  Meds, vitals, and allergies reviewed.   ROS: Per HPI.  Unless specifically indicated otherwise in HPI, the patient denies:  General: fever. Eyes: acute vision changes ENT: sore throat Cardiovascular: chest pain Respiratory: SOB GI: vomiting GU: dysuria Musculoskeletal: acute back pain Derm: acute rash Neuro: acute motor dysfunction Psych: worsening mood Endocrine: polydipsia Heme: bleeding Allergy: hayfever  GEN: nad, alert and oriented HEENT: mucous membranes moist NECK: supple w/o LA CV: rrr. PULM: ctab, no inc wob ABD: soft, +bs EXT: no edema SKIN: no acute rash

## 2017-11-15 NOTE — Assessment & Plan Note (Signed)
Living will d/w pt.  Son designated if patient were incapacitated.   

## 2017-11-15 NOTE — Patient Instructions (Signed)
Tdap shot today.  I would get a flu shot each fall.   Keep going as is.  Take care.  Glad to see you.  Recheck next year.   Update me as needed.

## 2017-11-16 NOTE — Assessment & Plan Note (Addendum)
Tdap 2019 Flu shot encouraged Colonoscopy done 2010 PSA wnl.  Diet and exercise- doing well on both.  Labs d/w pt.  Living will d/w pt. Son designated if patient were incapacitated.  HIV and HCV neg prev.   I do not feel a recurrent hernia.  He will update me as needed. No sig skin findings noted, no atypical moles, lesions, etc.

## 2017-11-16 NOTE — Assessment & Plan Note (Signed)
He has been taking protonix a few days a week.  His abd sx resolved on probiotics.  Rare use of zantac.  No dysphagia with prev esophageal dilation.  Continue as is.

## 2017-11-16 NOTE — Assessment & Plan Note (Signed)
Labs discussed with patient.  Continue healthy diet and exercise.

## 2017-11-16 NOTE — Assessment & Plan Note (Signed)
PSA wnl.  D/w pt.   

## 2017-12-31 DIAGNOSIS — M79662 Pain in left lower leg: Secondary | ICD-10-CM | POA: Diagnosis not present

## 2017-12-31 DIAGNOSIS — S8992XA Unspecified injury of left lower leg, initial encounter: Secondary | ICD-10-CM | POA: Diagnosis not present

## 2018-01-03 DIAGNOSIS — S86112A Strain of other muscle(s) and tendon(s) of posterior muscle group at lower leg level, left leg, initial encounter: Secondary | ICD-10-CM | POA: Diagnosis not present

## 2018-06-13 DIAGNOSIS — D225 Melanocytic nevi of trunk: Secondary | ICD-10-CM | POA: Diagnosis not present

## 2018-06-13 DIAGNOSIS — L820 Inflamed seborrheic keratosis: Secondary | ICD-10-CM | POA: Diagnosis not present

## 2018-06-13 DIAGNOSIS — D2262 Melanocytic nevi of left upper limb, including shoulder: Secondary | ICD-10-CM | POA: Diagnosis not present

## 2018-06-13 DIAGNOSIS — R208 Other disturbances of skin sensation: Secondary | ICD-10-CM | POA: Diagnosis not present

## 2018-06-13 DIAGNOSIS — D2261 Melanocytic nevi of right upper limb, including shoulder: Secondary | ICD-10-CM | POA: Diagnosis not present

## 2018-06-13 DIAGNOSIS — D2272 Melanocytic nevi of left lower limb, including hip: Secondary | ICD-10-CM | POA: Diagnosis not present

## 2018-08-08 ENCOUNTER — Encounter: Payer: Self-pay | Admitting: Family Medicine

## 2018-08-08 ENCOUNTER — Ambulatory Visit (INDEPENDENT_AMBULATORY_CARE_PROVIDER_SITE_OTHER): Payer: BLUE CROSS/BLUE SHIELD | Admitting: Family Medicine

## 2018-08-08 VITALS — BP 120/76 | HR 64 | Temp 97.7°F | Ht 72.0 in | Wt 160.5 lb

## 2018-08-08 DIAGNOSIS — R140 Abdominal distension (gaseous): Secondary | ICD-10-CM | POA: Diagnosis not present

## 2018-08-08 NOTE — Progress Notes (Signed)
Mother in law died this fall, condolences offered.    Sx started just before Thanksgiving 2019, needing to burp and straining to burp.  No passed blood.  No vomiting.  Normal BMs.  Some bloating in the abd episodically, not all day.  Wakes up feeling well w/o bloating.    Weight is stable.    He was on PPI until the end of 06/2018, no clear benefit on med or change coming off it.    Still working out at baseline.  No FCNAVD.    He doesn't have dysphagia.  Up to date on colonoscopy.    Meds, vitals, and allergies reviewed.   ROS: Per HPI unless specifically indicated in ROS section   GEN: nad, alert and oriented HEENT: mucous membranes moist NECK: supple w/o LA CV: rrr.  no murmur PULM: ctab, no inc wob ABD: soft, +bs,Minimally ttp just inferior to xiphoid.  Not ttp o/w.  EXT: no edema SKIN: no acute rash

## 2018-08-08 NOTE — Patient Instructions (Addendum)
Go to the lab on the way out.  We'll contact you with your lab report.  We make arrangements for referrals, extra imaging, and other appointments based on the urgency of the situation. Referrals are handled based on the clinical situation, not in the order that they are placed. If you do not see one of our referral coordinators on the way out of the clinic today, then you should expect a call in the next 1 to 2 weeks. We work diligently to process all referrals as quickly as possible.    Take care.  Glad to see you.  Update me as needed.

## 2018-08-09 DIAGNOSIS — R140 Abdominal distension (gaseous): Secondary | ICD-10-CM | POA: Insufficient documentation

## 2018-08-09 LAB — COMPREHENSIVE METABOLIC PANEL
ALT: 12 U/L (ref 0–53)
AST: 24 U/L (ref 0–37)
Albumin: 4.4 g/dL (ref 3.5–5.2)
Alkaline Phosphatase: 65 U/L (ref 39–117)
BUN: 15 mg/dL (ref 6–23)
CO2: 32 mEq/L (ref 19–32)
CREATININE: 1.2 mg/dL (ref 0.40–1.50)
Calcium: 9.5 mg/dL (ref 8.4–10.5)
Chloride: 101 mEq/L (ref 96–112)
GFR: 61.96 mL/min (ref >60.00)
Glucose, Bld: 111 mg/dL — ABNORMAL HIGH (ref 70–99)
Potassium: 4.1 mEq/L (ref 3.5–5.1)
Sodium: 137 mEq/L (ref 135–145)
Total Bilirubin: 0.8 mg/dL (ref 0.2–1.2)
Total Protein: 7 g/dL (ref 6.0–8.3)

## 2018-08-09 LAB — CBC WITH DIFFERENTIAL/PLATELET
BASOS PCT: 1 % (ref 0.0–3.0)
Basophils Absolute: 0.1 10*3/uL (ref 0.0–0.1)
EOS ABS: 0.2 10*3/uL (ref 0.0–0.7)
EOS PCT: 3.4 % (ref 0.0–5.0)
HEMATOCRIT: 40.4 % (ref 39.0–52.0)
Hemoglobin: 13.8 g/dL (ref 13.0–17.0)
LYMPHS PCT: 30.6 % (ref 12.0–46.0)
Lymphs Abs: 1.9 10*3/uL (ref 0.7–4.0)
MCHC: 34.2 g/dL (ref 30.0–36.0)
MCV: 90.7 fl (ref 78.0–100.0)
Monocytes Absolute: 0.7 10*3/uL (ref 0.1–1.0)
Monocytes Relative: 10.8 % (ref 3.0–12.0)
NEUTROS ABS: 3.5 10*3/uL (ref 1.4–7.7)
Neutrophils Relative %: 54.2 % (ref 43.0–77.0)
PLATELETS: 208 10*3/uL (ref 150.0–400.0)
RBC: 4.45 Mil/uL (ref 4.22–5.81)
RDW: 12.5 % (ref 11.5–15.5)
WBC: 6.4 10*3/uL (ref 4.0–10.5)

## 2018-08-09 LAB — LIPASE: Lipase: 86 U/L — ABNORMAL HIGH (ref 11.0–59.0)

## 2018-08-09 NOTE — Assessment & Plan Note (Signed)
With minimal tenderness just inferior to the xiphoid.  Discussed with patient about options.  He has had a history of EGD with prior stenosis previously dilated.  He will be due for follow-up colonoscopy at the end of this year.  Discussed options.  Check routine labs today.  Overall he has benign exam without alarming findings otherwise.  Refer back to GI to see if they are going to prefer repeat endoscopy versus imaging.  He agrees with plan.  See notes on labs.

## 2018-08-10 ENCOUNTER — Other Ambulatory Visit: Payer: Self-pay | Admitting: Family Medicine

## 2018-08-10 DIAGNOSIS — R748 Abnormal levels of other serum enzymes: Secondary | ICD-10-CM

## 2018-08-10 NOTE — Progress Notes (Signed)
Please call pt.  I talked to Dr. Servando Snare.  Would get u/s done.  Ordered.  He'll get a call about that.  Would still see GI after u/s done.  Thanks.    Patient advised.

## 2018-08-11 ENCOUNTER — Encounter: Payer: Self-pay | Admitting: *Deleted

## 2018-08-17 ENCOUNTER — Ambulatory Visit
Admission: RE | Admit: 2018-08-17 | Discharge: 2018-08-17 | Disposition: A | Discharge status: Home / Self Care | Payer: BLUE CROSS/BLUE SHIELD | Source: Ambulatory Visit | Attending: Family Medicine | Admitting: Family Medicine

## 2018-08-17 DIAGNOSIS — R748 Abnormal levels of other serum enzymes: Secondary | ICD-10-CM

## 2018-09-14 ENCOUNTER — Ambulatory Visit (INDEPENDENT_AMBULATORY_CARE_PROVIDER_SITE_OTHER): Payer: BLUE CROSS/BLUE SHIELD | Admitting: Gastroenterology

## 2018-09-14 ENCOUNTER — Other Ambulatory Visit: Payer: Self-pay

## 2018-09-14 ENCOUNTER — Encounter: Payer: Self-pay | Admitting: Gastroenterology

## 2018-09-14 VITALS — BP 132/72 | HR 60 | Ht 72.0 in | Wt 158.6 lb

## 2018-09-14 DIAGNOSIS — R140 Abdominal distension (gaseous): Secondary | ICD-10-CM | POA: Diagnosis not present

## 2018-09-14 DIAGNOSIS — G8929 Other chronic pain: Secondary | ICD-10-CM | POA: Diagnosis not present

## 2018-09-14 DIAGNOSIS — R1310 Dysphagia, unspecified: Secondary | ICD-10-CM

## 2018-09-14 DIAGNOSIS — R1013 Epigastric pain: Secondary | ICD-10-CM

## 2018-09-14 NOTE — Progress Notes (Signed)
Primary Care Physician: Joaquim Nam, MD  Primary Gastroenterologist:  Dr. Midge Minium  Chief Complaint  Patient presents with  . Bloated    HPI: Thomas Welch is a 58 y.o. male here for some epigastric discomfort.  The patient was seen by his primary care provider and found to have tenderness in the epigastric area.  The patient's primary care provider had contacted me and we discussed the case whereupon we decided to get the patient in for an ultrasound and follow-up with me.  The patient's ultrasound was negative for any cause for any abdominal pain.  The patient's last colonoscopy was in 2010 and he is due for colonoscopy this year.  The patient had an upper endoscopy by me in 2016 dysphasia and had dilation of his esophagus that time. The patient reports that his abdominal discomfort is associated with bloating and excessive burping.  On further questioning it appears that the patient's symptoms started when he started to exercise in the pool.  He states that he has to stop swimming because he feels the need to burp many times while swimming.  There is no report of any unexplained weight loss fevers chills nausea or vomiting.  The patient has stopped his Protonix in the past and reports that he is not having overt dysphagia but sometimes feels that things are hanging up but states that it is very intermittent.  Current Outpatient Medications  Medication Sig Dispense Refill  . cyanocobalamin 1000 MCG tablet Take 1,000 mcg by mouth. Once a week    . Misc Natural Products (TART CHERRY ADVANCED) CAPS Take 1 capsule by mouth once a week.    . Probiotic Product (ALOE 44920 & PROBIOTICS PO) Take by mouth.    . TURMERIC PO Take by mouth.    Marland Kitchen VITAMIN E PO Take 1 tablet by mouth once a week.     No current facility-administered medications for this visit.     Allergies as of 09/14/2018  . (No Known Allergies)    ROS:  General: Negative for anorexia, weight loss, fever, chills,  fatigue, weakness. ENT: Negative for hoarseness, difficulty swallowing , nasal congestion. CV: Negative for chest pain, angina, palpitations, dyspnea on exertion, peripheral edema.  Respiratory: Negative for dyspnea at rest, dyspnea on exertion, cough, sputum, wheezing.  GI: See history of present illness. GU:  Negative for dysuria, hematuria, urinary incontinence, urinary frequency, nocturnal urination.  Endo: Negative for unusual weight change.    Physical Examination:   BP 132/72   Pulse 60   Ht 6' (1.829 m)   Wt 158 lb 9.6 oz (71.9 kg)   BMI 21.51 kg/m   General: Well-nourished, well-developed in no acute distress.  Eyes: No icterus. Conjunctivae pink. Mouth: Oropharyngeal mucosa moist and pink , no lesions erythema or exudate. Lungs: Clear to auscultation bilaterally. Non-labored. Heart: Regular rate and rhythm, no murmurs rubs or gallops.  Abdomen: Bowel sounds are normal, nontender, nondistended, no hepatosplenomegaly or masses, no abdominal bruits or hernia , no rebound or guarding.   Extremities: No lower extremity edema. No clubbing or deformities. Neuro: Alert and oriented x 3.  Grossly intact. Skin: Warm and dry, no jaundice.   Psych: Alert and cooperative, normal mood and affect.  Labs:    Imaging Studies: US Abdomen Complete  Result Date: 08/17/2018 CLINICAL DATA:  Abdominal bloating.  Elevated lipase. EXAM: ABDOMEN ULTRASOUND COMPLETE COMPARISON:  None. FINDINGS: Gallbladder: No gallstones or Marse thickening visualized. No sonographic Murphy sign noted by sonographer.  Common bile duct: Diameter: 0.3 cm Liver: No focal lesion identified. Within normal limits in parenchymal echogenicity. Portal vein is patent on color Doppler imaging with normal direction of blood flow towards the liver. IVC: No abnormality visualized. Pancreas: Visualized portion unremarkable. Spleen: Size and appearance within normal limits. Right Kidney: Length: 9.7 cm. Echogenicity within normal  limits. No mass or hydronephrosis visualized. Left Kidney: Length: 11.2 cm. Echogenicity within normal limits. No mass or hydronephrosis visualized. Abdominal aorta: No aneurysm visualized. Other findings: None. IMPRESSION: Normal abdominal ultrasound. Electronically Signed   By: Drusilla Kanner M.D.   On: 08/17/2018 15:10    Assessment and Plan:   Thomas Welch is a 58 y.o. y/o male who comes in today with epigastric discomfort that is likely related to aerophagia.  The patient is likely swallowing a lot of air during his swimming. He also has a history of reflux and may be swallowing air due to his recent stop his Protonix.  The patient has been told to try and restart the Protonix and to avoid swallowing air when he exercises.  The patient states that he is going to try and stop swimming for a week to see if there is any difference.  He has also been encouraged to possibly use a midline snorkle to decrease the amount of swallowed air. The patient will also contact us be set up for a EGD and colonoscopy for his mild dysphagia and screening colonoscopy. The patient has been explained the plan and agrees with it.    Midge Minium, MD. Clementeen Graham   Note: This dictation was prepared with Dragon dictation along with smaller phrase technology. Any transcriptional errors that result from this process are unintentional.

## 2018-09-14 NOTE — H&P (View-Only) (Signed)
  Primary Care Physician: Duncan, Graham S, MD  Primary Gastroenterologist:  Dr. Ramonda Galyon  Chief Complaint  Patient presents with  . Bloated    HPI: Thomas Welch is a 58 y.o. male here for some epigastric discomfort.  The patient was seen by his primary care provider and found to have tenderness in the epigastric area.  The patient's primary care provider had contacted me and we discussed the case whereupon we decided to get the patient in for an ultrasound and follow-up with me.  The patient's ultrasound was negative for any cause for any abdominal pain.  The patient's last colonoscopy was in 2010 and he is due for colonoscopy this year.  The patient had an upper endoscopy by me in 2016 dysphasia and had dilation of his esophagus that time. The patient reports that his abdominal discomfort is associated with bloating and excessive burping.  On further questioning it appears that the patient's symptoms started when he started to exercise in the pool.  He states that he has to stop swimming because he feels the need to burp many times while swimming.  There is no report of any unexplained weight loss fevers chills nausea or vomiting.  The patient has stopped his Protonix in the past and reports that he is not having overt dysphagia but sometimes feels that things are hanging up but states that it is very intermittent.  Current Outpatient Medications  Medication Sig Dispense Refill  . cyanocobalamin 1000 MCG tablet Take 1,000 mcg by mouth. Once a week    . Misc Natural Products (TART CHERRY ADVANCED) CAPS Take 1 capsule by mouth once a week.    . Probiotic Product (ALOE 10000 & PROBIOTICS PO) Take by mouth.    . TURMERIC PO Take by mouth.    . VITAMIN E PO Take 1 tablet by mouth once a week.     No current facility-administered medications for this visit.     Allergies as of 09/14/2018  . (No Known Allergies)    ROS:  General: Negative for anorexia, weight loss, fever, chills,  fatigue, weakness. ENT: Negative for hoarseness, difficulty swallowing , nasal congestion. CV: Negative for chest pain, angina, palpitations, dyspnea on exertion, peripheral edema.  Respiratory: Negative for dyspnea at rest, dyspnea on exertion, cough, sputum, wheezing.  GI: See history of present illness. GU:  Negative for dysuria, hematuria, urinary incontinence, urinary frequency, nocturnal urination.  Endo: Negative for unusual weight change.    Physical Examination:   BP 132/72   Pulse 60   Ht 6' (1.829 m)   Wt 158 lb 9.6 oz (71.9 kg)   BMI 21.51 kg/m   General: Well-nourished, well-developed in no acute distress.  Eyes: No icterus. Conjunctivae pink. Mouth: Oropharyngeal mucosa moist and pink , no lesions erythema or exudate. Lungs: Clear to auscultation bilaterally. Non-labored. Heart: Regular rate and rhythm, no murmurs rubs or gallops.  Abdomen: Bowel sounds are normal, nontender, nondistended, no hepatosplenomegaly or masses, no abdominal bruits or hernia , no rebound or guarding.   Extremities: No lower extremity edema. No clubbing or deformities. Neuro: Alert and oriented x 3.  Grossly intact. Skin: Warm and dry, no jaundice.   Psych: Alert and cooperative, normal mood and affect.  Labs:    Imaging Studies: Us Abdomen Complete  Result Date: 08/17/2018 CLINICAL DATA:  Abdominal bloating.  Elevated lipase. EXAM: ABDOMEN ULTRASOUND COMPLETE COMPARISON:  None. FINDINGS: Gallbladder: No gallstones or Geiselman thickening visualized. No sonographic Murphy sign noted by sonographer.   Common bile duct: Diameter: 0.3 cm Liver: No focal lesion identified. Within normal limits in parenchymal echogenicity. Portal vein is patent on color Doppler imaging with normal direction of blood flow towards the liver. IVC: No abnormality visualized. Pancreas: Visualized portion unremarkable. Spleen: Size and appearance within normal limits. Right Kidney: Length: 9.7 cm. Echogenicity within normal  limits. No mass or hydronephrosis visualized. Left Kidney: Length: 11.2 cm. Echogenicity within normal limits. No mass or hydronephrosis visualized. Abdominal aorta: No aneurysm visualized. Other findings: None. IMPRESSION: Normal abdominal ultrasound. Electronically Signed   By: Thomas  Dalessio M.D.   On: 08/17/2018 15:10    Assessment and Plan:   [name] is a 58 y.o. y/o male who comes in today with epigastric discomfort that is likely related to aerophagia.  The patient is likely swallowing a lot of air during his swimming. He also has a history of reflux and may be swallowing air due to his recent stop his Protonix.  The patient has been told to try and restart the Protonix and to avoid swallowing air when he exercises.  The patient states that he is going to try and stop swimming for a week to see if there is any difference.  He has also been encouraged to possibly use a midline snorkle to decrease the amount of swallowed air. The patient will also contact us be set up for a EGD and colonoscopy for his mild dysphagia and screening colonoscopy. The patient has been explained the plan and agrees with it.    Ellis Mehaffey, MD. FACG   Note: This dictation was prepared with Dragon dictation along with smaller phrase technology. Any transcriptional errors that result from this process are unintentional. 

## 2018-09-18 ENCOUNTER — Telehealth: Payer: Self-pay | Admitting: Gastroenterology

## 2018-09-18 ENCOUNTER — Other Ambulatory Visit: Payer: Self-pay

## 2018-09-18 DIAGNOSIS — Z1211 Encounter for screening for malignant neoplasm of colon: Secondary | ICD-10-CM

## 2018-09-18 MED ORDER — NA SULFATE-K SULFATE-MG SULF 17.5-3.13-1.6 GM/177ML PO SOLN: 1.0000 | ORAL | 0 refills | Status: DC

## 2018-09-18 NOTE — Telephone Encounter (Signed)
Patient called & would like to schedule an upper & lower colonoscopy. He prefers 10-02-2018 or 10-09-18,But 10-04-2018,10-10-23,and3-26-20 will work.

## 2018-09-18 NOTE — Telephone Encounter (Signed)
Pt has been scheduled for a colonoscopy and EGD with Wohl at [facility] on 10/02/18.

## 2018-09-21 ENCOUNTER — Other Ambulatory Visit: Payer: Self-pay

## 2018-09-21 ENCOUNTER — Encounter: Payer: Self-pay | Admitting: *Deleted

## 2018-09-29 NOTE — Discharge Instructions (Signed)
General Anesthesia, Adult, Care After  This sheet gives you information about how to care for yourself after your procedure. Your health care provider may also give you more specific instructions. If you have problems or questions, contact your health care provider.  What can I expect after the procedure?  After the procedure, the following side effects are common:  Pain or discomfort at the IV site.  Nausea.  Vomiting.  Sore throat.  Trouble concentrating.  Feeling cold or chills.  Weak or tired.  Sleepiness and fatigue.  Soreness and body aches. These side effects can affect parts of the body that were not involved in surgery.  Follow these instructions at home:    For at least 24 hours after the procedure:  Have a responsible adult stay with you. It is important to have someone help care for you until you are awake and alert.  Rest as needed.  Do not:  Participate in activities in which you could fall or become injured.  Drive.  Use heavy machinery.  Drink alcohol.  Take sleeping pills or medicines that cause drowsiness.  Make important decisions or sign legal documents.  Take care of children on your own.  Eating and drinking  Follow any instructions from your health care provider about eating or drinking restrictions.  When you feel hungry, start by eating small amounts of foods that are soft and easy to digest (bland), such as toast. Gradually return to your regular diet.  Drink enough fluid to keep your urine pale yellow.  If you vomit, rehydrate by drinking water, juice, or clear broth.  General instructions  If you have sleep apnea, surgery and certain medicines can increase your risk for breathing problems. Follow instructions from your health care provider about wearing your sleep device:  Anytime you are sleeping, including during daytime naps.  While taking prescription pain medicines, sleeping medicines, or medicines that make you drowsy.  Return to your normal activities as told by your health care  provider. Ask your health care provider what activities are safe for you.  Take over-the-counter and prescription medicines only as told by your health care provider.  If you smoke, do not smoke without supervision.  Keep all follow-up visits as told by your health care provider. This is important.  Contact a health care provider if:  You have nausea or vomiting that does not get better with medicine.  You cannot eat or drink without vomiting.  You have pain that does not get better with medicine.  You are unable to pass urine.  You develop a skin rash.  You have a fever.  You have redness around your IV site that gets worse.  Get help right away if:  You have difficulty breathing.  You have chest pain.  You have blood in your urine or stool, or you vomit blood.  Summary  After the procedure, it is common to have a sore throat or nausea. It is also common to feel tired.  Have a responsible adult stay with you for the first 24 hours after general anesthesia. It is important to have someone help care for you until you are awake and alert.  When you feel hungry, start by eating small amounts of foods that are soft and easy to digest (bland), such as toast. Gradually return to your regular diet.  Drink enough fluid to keep your urine pale yellow.  Return to your normal activities as told by your health care provider. Ask your health care   provider what activities are safe for you.  This information is not intended to replace advice given to you by your health care provider. Make sure you discuss any questions you have with your health care provider.  Document Released: 10/11/2000 Document Revised: 02/18/2017 Document Reviewed: 02/18/2017  Elsevier Interactive Patient Education  2019 [employer]

## 2018-10-02 ENCOUNTER — Ambulatory Visit: Payer: BLUE CROSS/BLUE SHIELD | Admitting: Anesthesiology

## 2018-10-02 ENCOUNTER — Ambulatory Visit
Admission: RE | Admit: 2018-10-02 | Discharge: 2018-10-02 | Disposition: A | Discharge status: Home / Self Care | Payer: BLUE CROSS/BLUE SHIELD | Attending: Gastroenterology | Admitting: Gastroenterology

## 2018-10-02 ENCOUNTER — Encounter
Admission: RE | Disposition: A | Discharge status: Home / Self Care | Payer: Self-pay | Source: Home / Self Care | Attending: Gastroenterology

## 2018-10-02 ENCOUNTER — Other Ambulatory Visit: Payer: Self-pay

## 2018-10-02 DIAGNOSIS — R1310 Dysphagia, unspecified: Secondary | ICD-10-CM

## 2018-10-02 DIAGNOSIS — K641 Second degree hemorrhoids: Secondary | ICD-10-CM | POA: Insufficient documentation

## 2018-10-02 DIAGNOSIS — Z79899 Other long term (current) drug therapy: Secondary | ICD-10-CM | POA: Insufficient documentation

## 2018-10-02 DIAGNOSIS — Z1211 Encounter for screening for malignant neoplasm of colon: Secondary | ICD-10-CM | POA: Insufficient documentation

## 2018-10-02 DIAGNOSIS — R140 Abdominal distension (gaseous): Secondary | ICD-10-CM | POA: Diagnosis not present

## 2018-10-02 HISTORY — PX: ESOPHAGOGASTRODUODENOSCOPY (EGD) WITH PROPOFOL: SHX5813

## 2018-10-02 HISTORY — DX: Dental restoration status: Z98.811

## 2018-10-02 HISTORY — PX: ESOPHAGEAL DILATION: SHX303

## 2018-10-02 HISTORY — PX: COLONOSCOPY WITH PROPOFOL: SHX5780

## 2018-10-02 SURGERY — COLONOSCOPY WITH PROPOFOL
Anesthesia: General | Site: Throat

## 2018-10-02 MED ORDER — STERILE WATER FOR IRRIGATION IR SOLN
Status: DC | PRN
  Administered 2018-10-02: 11:00:00

## 2018-10-02 MED ORDER — GLYCOPYRROLATE 0.2 MG/ML IJ SOLN
INTRAMUSCULAR | Status: DC | PRN
  Administered 2018-10-02: 0.2 mg via INTRAVENOUS

## 2018-10-02 MED ORDER — LACTATED RINGERS IV SOLN
INTRAVENOUS | Status: DC
  Administered 2018-10-02: 10:00:00 via INTRAVENOUS

## 2018-10-02 MED ORDER — PROPOFOL 10 MG/ML IV BOLUS
INTRAVENOUS | Status: DC | PRN
  Administered 2018-10-02 (×4): 20 mg via INTRAVENOUS
  Administered 2018-10-02: 30 mg via INTRAVENOUS
  Administered 2018-10-02 (×3): 20 mg via INTRAVENOUS
  Administered 2018-10-02: 30 mg via INTRAVENOUS
  Administered 2018-10-02 (×3): 20 mg via INTRAVENOUS
  Administered 2018-10-02: 80 mg via INTRAVENOUS

## 2018-10-02 MED ORDER — LIDOCAINE HCL (CARDIAC) PF 100 MG/5ML IV SOSY
PREFILLED_SYRINGE | INTRAVENOUS | Status: DC | PRN
  Administered 2018-10-02: 40 mg via INTRAVENOUS

## 2018-10-02 SURGICAL SUPPLY — 23 items
BLOCK BITE 60FR ADLT L/F GRN (MISCELLANEOUS) ×5 IMPLANT
BOUGIE MALONEY 54FR 18 (INSTRUMENTS) ×2 IMPLANT
CANISTER SUCT 1200ML W/VALVE (MISCELLANEOUS) ×5 IMPLANT
CLIP HMST 235XBRD CATH ROT (MISCELLANEOUS) IMPLANT
CLIP RESOLUTION 360 11X235 (MISCELLANEOUS)
ELECT REM PT RETURN 9FT ADLT (ELECTROSURGICAL)
ELECTRODE REM PT RTRN 9FT ADLT (ELECTROSURGICAL) IMPLANT
FCP ESCP3.2XJMB 240X2.8X (MISCELLANEOUS)
FORCEPS BIOP RAD 4 LRG CAP 4 (CUTTING FORCEPS) IMPLANT
FORCEPS BIOP RJ4 240 W/NDL (MISCELLANEOUS)
FORCEPS ESCP3.2XJMB 240X2.8X (MISCELLANEOUS) IMPLANT
GOWN CVR UNV OPN BCK APRN NK (MISCELLANEOUS) ×6 IMPLANT
GOWN ISOL THUMB LOOP REG UNIV (MISCELLANEOUS) ×10
INJECTOR VARIJECT VIN23 (MISCELLANEOUS) IMPLANT
KIT ENDO PROCEDURE OLY (KITS) ×5 IMPLANT
MARKER SPOT ENDO TATTOO 5ML (MISCELLANEOUS) IMPLANT
SNARE SHORT THROW 13M SML OVAL (MISCELLANEOUS) IMPLANT
SNARE SHORT THROW 30M LRG OVAL (MISCELLANEOUS) IMPLANT
SPOT EX ENDOSCOPIC TATTOO (MISCELLANEOUS)
SYR INFLATION 60ML (SYRINGE) IMPLANT
TRAP ETRAP POLY (MISCELLANEOUS) IMPLANT
VARIJECT INJECTOR VIN23 (MISCELLANEOUS)
WATER STERILE IRR 250ML POUR (IV SOLUTION) ×5 IMPLANT

## 2018-10-02 NOTE — Anesthesia Postprocedure Evaluation (Signed)
Anesthesia Post Note  Patient: Thomas Welch  Procedure(s) Performed: COLONOSCOPY WITH PROPOFOL (N/A Rectum) ESOPHAGOGASTRODUODENOSCOPY (EGD) WITH PROPOFOL (N/A Throat) ESOPHAGEAL DILATION (N/A Esophagus)  Patient location during evaluation: PACU Anesthesia Type: General Level of consciousness: awake and alert Pain management: pain level controlled Vital Signs Assessment: post-procedure vital signs reviewed and stable Respiratory status: spontaneous breathing Cardiovascular status: stable Anesthetic complications: no    Verner Chol, III,  Jordan Pardini D

## 2018-10-02 NOTE — Op Note (Signed)
[facility] Gastroenterology Patient Name: Thomas Welch Procedure Date: 10/02/2018 10:28 AM MRN: [id number] Account #: [id number] Date of Birth: 10/18/1960 Admit Type: Outpatient Age: 58 Room: [facility] OR ROOM 01 Gender: Male Note Status: Finalized Procedure:            Upper GI endoscopy Indications:          Dysphagia Providers:            Midge Minium MD, MD Referring MD:         Dwana Curd. Para March, MD (Referring MD) Medicines:            Propofol per Anesthesia Complications:        No immediate complications. Procedure:            Pre-Anesthesia Assessment:                       - Prior to the procedure, a History and Physical was                        performed, and patient medications and allergies were                        reviewed. The patient's tolerance of previous                        anesthesia was also reviewed. The risks and benefits of                        the procedure and the sedation options and risks were                        discussed with the patient. All questions were                        answered, and informed consent was obtained. Prior                        Anticoagulants: The patient has taken no previous                        anticoagulant or antiplatelet agents. ASA Grade                        Assessment: II - A patient with mild systemic disease.                        After reviewing the risks and benefits, the patient was                        deemed in satisfactory condition to undergo the                        procedure.                       After obtaining informed consent, the endoscope was                        passed under direct vision. Throughout the procedure,  the patient's blood pressure, pulse, and oxygen                        saturations were monitored continuously. The was                        introduced through the mouth, and advanced to the                        second part of  duodenum. The upper GI endoscopy was                        accomplished without difficulty. The patient tolerated                        the procedure well. Findings:      The examined esophagus was normal. The scope was withdrawn. Dilation was       performed with a Maloney dilator with no resistance at 54 Fr.      The stomach was normal.      The examined duodenum was normal. Impression:           - Normal esophagus. Dilated.                       - Normal stomach.                       - Normal examined duodenum.                       - No specimens collected. Recommendation:       - Discharge patient to home.                       - Resume previous diet.                       - Continue present medications.                       - Perform a colonoscopy today. Procedure Code(s):    --- Professional ---                       [phone], Esophagogastroduodenoscopy, flexible, transoral;                        diagnostic, including collection of specimen(s) by                        brushing or washing, when performed (separate procedure)                       43450, Dilation of esophagus, by unguided sound or                        bougie, single or multiple passes Diagnosis Code(s):    --- Professional ---                       R13.10, Dysphagia, unspecified CPT copyright 2018 American Medical Association. All rights reserved. The codes documented in this report are preliminary and upon coder review may  be revised  to meet current compliance requirements. Midge Minium MD, MD 10/02/2018 10:40:27 AM This report has been signed electronically. Number of Addenda: 0 Note Initiated On: 10/02/2018 10:28 AM Total Procedure Duration: 0 hours 3 minutes 36 seconds       [facility]

## 2018-10-02 NOTE — Anesthesia Preprocedure Evaluation (Addendum)
Anesthesia Evaluation  Patient identified by MRN, date of birth, ID band Patient awake    Reviewed: Allergy & Precautions, H&P , NPO status , Patient's Chart, lab work & pertinent test results  History of Anesthesia Complications Negative for: history of anesthetic complications  Airway Mallampati: II  TM Distance: >3 FB Neck ROM: full    Dental no notable dental hx.    Pulmonary neg pulmonary ROS,    Pulmonary exam normal breath sounds clear to auscultation       Cardiovascular negative cardio ROS Normal cardiovascular exam Rhythm:regular Rate:Normal     Neuro/Psych    GI/Hepatic Neg liver ROS, Medicated,  Endo/Other  negative endocrine ROS  Renal/GU negative Renal ROS     Musculoskeletal   Abdominal   Peds  Hematology negative hematology ROS (+)   Anesthesia Other Findings   Reproductive/Obstetrics negative OB ROS                            Anesthesia Physical Anesthesia Plan  ASA: I  Anesthesia Plan: General   Post-op Pain Management:    Induction:   PONV Risk Score and Plan:   Airway Management Planned:   Additional Equipment:   Intra-op Plan:   Post-operative Plan:   Informed Consent: I have reviewed the patients History and Physical, chart, labs and discussed the procedure including the risks, benefits and alternatives for the proposed anesthesia with the patient or authorized representative who has indicated his/her understanding and acceptance.       Plan Discussed with:   Anesthesia Plan Comments:         Anesthesia Quick Evaluation

## 2018-10-02 NOTE — Interval H&P Note (Signed)
History and Physical Interval Note:  10/02/2018 10:00 AM  Thomas Welch  has presented today for surgery, with the diagnosis of Screening Z12.11 Dysphagia R13.10.  The various methods of treatment have been discussed with the patient and family. After consideration of risks, benefits and other options for treatment, the patient has consented to  Procedure(s) with comments: COLONOSCOPY WITH PROPOFOL (N/A) ESOPHAGOGASTRODUODENOSCOPY (EGD) WITH PROPOFOL (N/A) - requests later appt as a surgical intervention.  The patient's history has been reviewed, patient examined, no change in status, stable for surgery.  I have reviewed the patient's chart and labs.  Questions were answered to the patient's satisfaction.     Nitish Roes [employer]

## 2018-10-02 NOTE — Transfer of Care (Signed)
Immediate Anesthesia Transfer of Care Note  Patient: RECCO SCIACCA  Procedure(s) Performed: COLONOSCOPY WITH PROPOFOL (N/A Rectum) ESOPHAGOGASTRODUODENOSCOPY (EGD) WITH PROPOFOL (N/A Throat) ESOPHAGEAL DILATION (N/A Esophagus)  Patient Location: PACU  Anesthesia Type: General  Level of Consciousness: awake, alert  and patient cooperative  Airway and Oxygen Therapy: Patient Spontanous Breathing and Patient connected to supplemental oxygen  Post-op Assessment: Post-op Vital signs reviewed, Patient's Cardiovascular Status Stable, Respiratory Function Stable, Patent Airway and No signs of Nausea or vomiting  Post-op Vital Signs: Reviewed and stable  Complications: No apparent anesthesia complications

## 2018-10-02 NOTE — Op Note (Signed)
[facility] Gastroenterology Patient Name: Thomas Welch Procedure Date: 10/02/2018 10:26 AM MRN: [id number] Account #: [id number] Date of Birth: [birth year] Admit Type: Outpatient Age: 58 Room: [facility] OR ROOM 01 Gender: Male Note Status: Finalized Procedure:            Colonoscopy Indications:          Screening for colorectal malignant neoplasm Providers:            Midge Minium MD, MD Medicines:            Propofol per Anesthesia Complications:        No immediate complications. Procedure:            Pre-Anesthesia Assessment:                       - Prior to the procedure, a History and Physical was                        performed, and patient medications and allergies were                        reviewed. The patient's tolerance of previous                        anesthesia was also reviewed. The risks and benefits of                        the procedure and the sedation options and risks were                        discussed with the patient. All questions were                        answered, and informed consent was obtained. Prior                        Anticoagulants: The patient has taken no previous                        anticoagulant or antiplatelet agents. ASA Grade                        Assessment: II - A patient with mild systemic disease.                        After reviewing the risks and benefits, the patient was                        deemed in satisfactory condition to undergo the                        procedure.                       After obtaining informed consent, the colonoscope was                        passed under direct vision. Throughout the procedure,                        the patient's blood pressure,  pulse, and oxygen                        saturations were monitored continuously. The was                        introduced through the anus and advanced to the the                        cecum, identified by appendiceal orifice and  ileocecal                        valve. The colonoscopy was performed without                        difficulty. The patient tolerated the procedure well.                        The quality of the bowel preparation was excellent. Findings:      The perianal and digital rectal examinations were normal.      Non-bleeding internal hemorrhoids were found during retroflexion. The       hemorrhoids were Grade II (internal hemorrhoids that prolapse but reduce       spontaneously). Impression:           - Non-bleeding internal hemorrhoids.                       - No specimens collected. Recommendation:       - Discharge patient to home.                       - Resume previous diet.                       - Continue present medications.                       - Repeat colonoscopy in 10 years for screening unless                        any change in family history or lower GI problems. Procedure Code(s):    --- Professional ---                       [phone], Colonoscopy, flexible; diagnostic, including                        collection of specimen(s) by brushing or washing, when                        performed (separate procedure) Diagnosis Code(s):    --- Professional ---                       Z12.11, Encounter for screening for malignant neoplasm                        of colon CPT copyright 2018 American Medical Association. All rights reserved. The codes documented in this report are preliminary and upon coder review may  be revised to meet current compliance requirements. Midge Minium MD, MD 10/02/2018 10:54:03 AM This report has been signed electronically.  Number of Addenda: 0 Note Initiated On: 10/02/2018 10:26 AM Scope Withdrawal Time: 0 hours 6 minutes 16 seconds  Total Procedure Duration: 0 hours 11 minutes 5 seconds       [facility]

## 2018-10-02 NOTE — Anesthesia Procedure Notes (Signed)
Performed by: Tehani Mersman, CRNA Pre-anesthesia Checklist: Patient identified, Emergency Drugs available, Suction available, Timeout performed and Patient being monitored Patient Re-evaluated:Patient Re-evaluated prior to induction Oxygen Delivery Method: Nasal cannula Placement Confirmation: positive ETCO2       

## 2018-10-03 ENCOUNTER — Encounter: Payer: Self-pay | Admitting: Gastroenterology

## 2018-11-16 ENCOUNTER — Other Ambulatory Visit: Payer: Self-pay | Admitting: Family Medicine

## 2018-11-16 DIAGNOSIS — R748 Abnormal levels of other serum enzymes: Secondary | ICD-10-CM

## 2018-11-16 DIAGNOSIS — Z8639 Personal history of other endocrine, nutritional and metabolic disease: Secondary | ICD-10-CM

## 2018-11-16 DIAGNOSIS — Z8042 Family history of malignant neoplasm of prostate: Secondary | ICD-10-CM

## 2018-11-17 ENCOUNTER — Other Ambulatory Visit (INDEPENDENT_AMBULATORY_CARE_PROVIDER_SITE_OTHER): Payer: BLUE CROSS/BLUE SHIELD

## 2018-11-17 ENCOUNTER — Other Ambulatory Visit: Payer: Self-pay

## 2018-11-17 DIAGNOSIS — Z8639 Personal history of other endocrine, nutritional and metabolic disease: Secondary | ICD-10-CM | POA: Diagnosis not present

## 2018-11-17 DIAGNOSIS — R748 Abnormal levels of other serum enzymes: Secondary | ICD-10-CM | POA: Diagnosis not present

## 2018-11-17 DIAGNOSIS — Z8042 Family history of malignant neoplasm of prostate: Secondary | ICD-10-CM

## 2018-11-17 LAB — LIPID PANEL
Cholesterol: 148 mg/dL (ref 0–200)
HDL: 57.3 mg/dL (ref >39.00)
LDL Cholesterol: 82 mg/dL (ref 0–99)
NonHDL: 90.27
Total CHOL/HDL Ratio: 3
Triglycerides: 41 mg/dL (ref 0.0–149.0)
VLDL: 8.2 mg/dL (ref 0.0–40.0)

## 2018-11-17 LAB — BASIC METABOLIC PANEL
BUN: 15 mg/dL (ref 6–23)
CO2: 30 mEq/L (ref 19–32)
Calcium: 9.3 mg/dL (ref 8.4–10.5)
Chloride: 104 mEq/L (ref 96–112)
Creatinine, Ser: 0.97 mg/dL (ref 0.40–1.50)
GFR: 79.13 mL/min (ref >60.00)
Glucose, Bld: 96 mg/dL (ref 70–99)
Potassium: 4.5 mEq/L (ref 3.5–5.1)
Sodium: 141 mEq/L (ref 135–145)

## 2018-11-17 LAB — LIPASE: Lipase: 46 U/L (ref 11.0–59.0)

## 2018-11-17 LAB — PSA: PSA: 0.49 ng/mL (ref 0.10–4.00)

## 2018-11-20 ENCOUNTER — Encounter: Payer: Self-pay | Admitting: Family Medicine

## 2018-11-20 ENCOUNTER — Ambulatory Visit (INDEPENDENT_AMBULATORY_CARE_PROVIDER_SITE_OTHER): Payer: BLUE CROSS/BLUE SHIELD | Admitting: Family Medicine

## 2018-11-20 VITALS — Ht 72.0 in | Wt 158.0 lb

## 2018-11-20 DIAGNOSIS — Z0000 Encounter for general adult medical examination without abnormal findings: Secondary | ICD-10-CM

## 2018-11-20 DIAGNOSIS — K222 Esophageal obstruction: Secondary | ICD-10-CM

## 2018-11-20 DIAGNOSIS — Z7189 Other specified counseling: Secondary | ICD-10-CM

## 2018-11-20 MED ORDER — PANTOPRAZOLE SODIUM 20 MG PO TBEC: 20.0000 mg | DELAYED_RELEASE_TABLET | Freq: Every day | ORAL | 3 refills | Status: DC

## 2018-11-20 NOTE — Assessment & Plan Note (Signed)
Living will d/w pt.  Son designated if patient were incapacitated.   

## 2018-11-20 NOTE — Assessment & Plan Note (Signed)
Still on PPI with prev esophageal dilation noted.  Swallowing well.  Would continue PPI,d/w pt.   Prev GI hx d/w pt.  Aerophagia resolved with cessation of swimming.  No issues with running.  Lipase normalized.

## 2018-11-20 NOTE — Assessment & Plan Note (Addendum)
Tdap 2019 Flu shot encouraged Shingles not due PNA due at 63 Colonoscopy done 2020 PSA wnl.  Diet and exercise- doing well on both.  Labs d/w pt.  Living will d/w pt. Son designated if patient were incapacitated.  HIV and HCV neg prev.    He is occ lightheaded on standing.  Not every time.  No vertigo.  No syncope.  D/w pt inc fluids and okay to liberalize salt.  Update me as needed.  He agrees.   Occ in the last few months he'll notice his pulse in his R ear.  No pain.  No hearing loss.  No CP.  Able to exercise at baseline.  Would observe for now.  Likely a benign issue.  He'll update me as needed.

## 2018-11-20 NOTE — Progress Notes (Signed)
Interactive audio and video telecommunications were attempted between this provider and patient, however failed, due to patient having technical difficulties OR patient did not have access to video capability.  We continued and completed visit with audio only.   Virtual Visit via Telephone Note  I connected with patient on 11/20/18 at 8:53 AM by telephone and verified that I am speaking with the correct person using two identifiers.  Location of patient: home  [facility] [place] [facility] Name of referring provider (if blank then none associated): Names per persons and role in encounter:  MD: Ferd HibbsG S , Patient: name listed above.    I discussed the limitations, risks, security and privacy concerns of performing an evaluation and management service by telephone and the availability of in person appointments. I also discussed with the patient that there may be a patient responsible charge related to this service. The patient expressed understanding and agreed to proceed.  History of Present Illness:   Tdap 2019 Flu shot encouraged Shingles not due PNA due at 63 Colonoscopy done 2020 PSA wnl.  Diet and exercise- doing well on both.  Labs d/w pt.  Living will d/w pt. Son designated if patient were incapacitated.  HIV and HCV neg prev.    H/o hyperglycemia, recently wnl.  Labs d/w pt.  Lipase normalized.  Still on PPI with prev esophageal dilation noted.  Swallowing well.  Would continue PPI,d/w pt.    Prev GI hx d/w pt.  Aerophagia resolved with cessation of swimming.  No issues with running.  Lipase normalized.    Pandemic considerations d/w pt.  He is working from home.  He is adjusting and doing well.    His daughter is expecting twins, d/w pt.    He is occ lightheaded on standing.  Not every time.  No vertigo.  If noted, it tends to be in the evening.  No syncope.  D/w pt inc fluids and okay to liberalize salt.    Occ in the last few months he'll notice his pulse in  his R ear.  No pain.  No hearing loss.  No CP.  Able to exercise at baseline.    PMH and SH reviewed  ROS: Per HPI unless specifically indicated in ROS section   Meds and allergies reviewed.      Observations/Objective:nad ncat  Temp 97 Pulse 52 this AM (likely related to fitness level)  Assessment and Plan: Tdap 2019 Flu shot encouraged Shingles not due PNA due at 63 Colonoscopy done 2020 PSA wnl.  Diet and exercise- doing well on both.  Labs d/w pt.  Living will d/w pt. Son designated if patient were incapacitated.  HIV and HCV neg prev.    H/o hyperglycemia, recently wnl.  Labs d/w pt.   Still on PPI with prev esophageal dilation noted.  Swallowing well.  Would continue PPI,d/w pt.   Prev GI hx d/w pt.  Aerophagia resolved with cessation of swimming.  No issues with running.  Lipase normalized.    Pandemic considerations d/w pt.  He is working from home.  He is adjusting and doing well.    His daughter is expecting twins, d/w pt.    He is occ lightheaded on standing.  Not every time.  No vertigo.  No syncope.  D/w pt inc fluids and okay to liberalize salt.  Update me as needed.  He agrees.   Occ in the last few months he'll notice his pulse in his R ear.  No pain.  No hearing loss.  No CP.  Able to exercise at baseline.  Would observe for now.  Likely a benign issue.  He'll update me as needed.    Follow Up Instructions: as above.   I discussed the assessment and treatment plan with the patient. The patient was provided an opportunity to ask questions and all were answered. The patient agreed with the plan and demonstrated an understanding of the instructions.   The patient was advised to call back or seek an in-person evaluation if the symptoms worsen or if the condition fails to improve as anticipated.  I provided 17 minutes of non-face-to-face time during this encounter.  Crawford Givens, MD

## 2018-11-22 ENCOUNTER — Telehealth: Payer: Self-pay | Admitting: Family Medicine

## 2018-11-22 NOTE — Telephone Encounter (Signed)
Should be visible know.  Usually they auto release to mychart.  Please notify pt.  Thanks.

## 2018-11-22 NOTE — Telephone Encounter (Signed)
Pt called stating he could not see his labs that was done 5/1 on my chart  Best number [phone]

## 2018-11-23 NOTE — Telephone Encounter (Signed)
They were viewed per lab section.

## 2019-02-01 ENCOUNTER — Other Ambulatory Visit: Payer: Self-pay | Admitting: Family Medicine

## 2019-06-02 IMAGING — US US ABDOMEN COMPLETE
1 series · 14 of 25 positions shown · non-contrast
Comparison: None.

CLINICAL DATA: Abdominal bloating.  Elevated lipase.

EXAM:
ABDOMEN ULTRASOUND COMPLETE

[Series 1: us abdomen complete · 0.15mm/px · 14 of 97 slices shown]
[im 1/97]
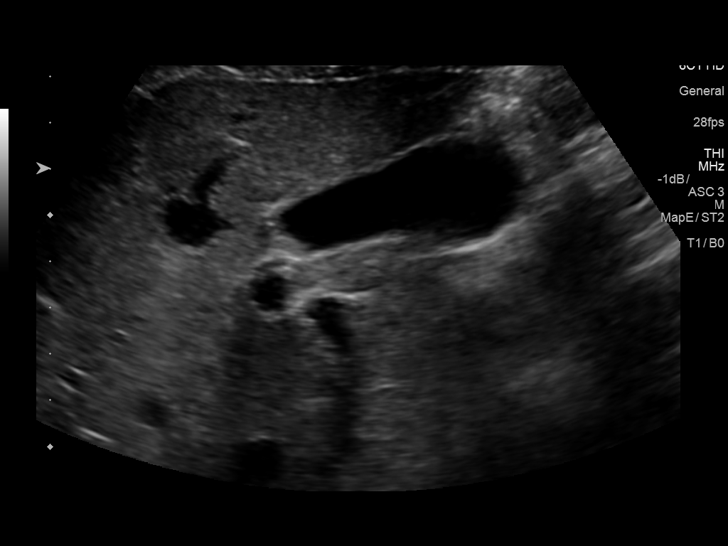
[im 9/97]
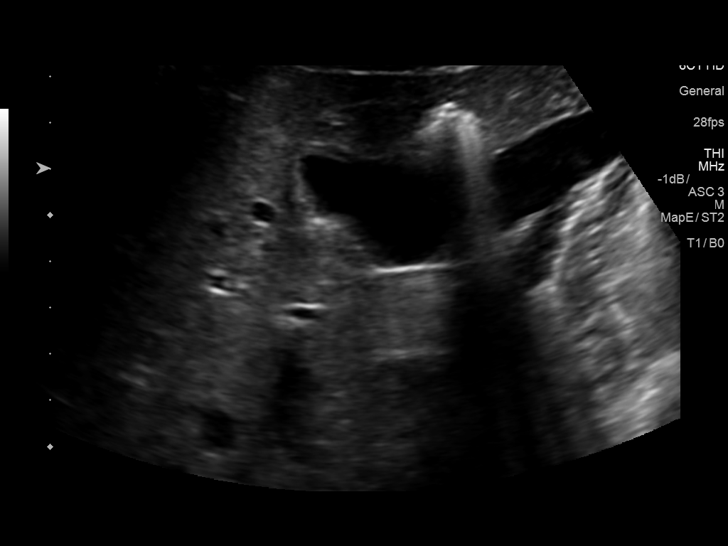
[im 17/97]
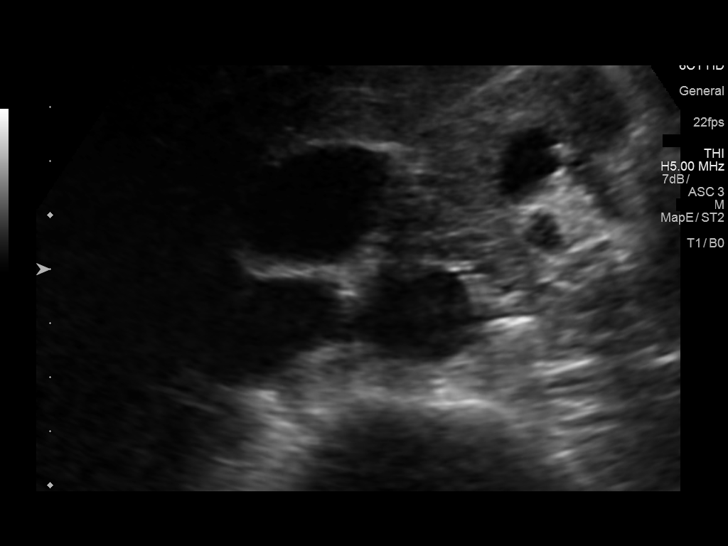
[im 25/97]
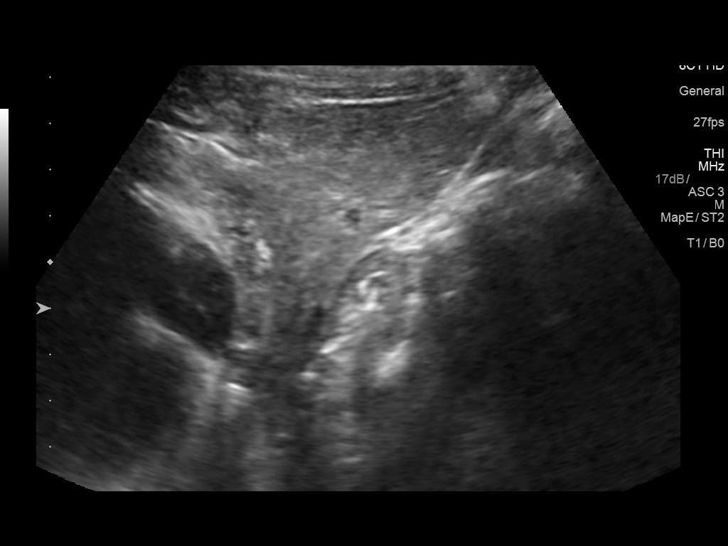
[im 33/97]
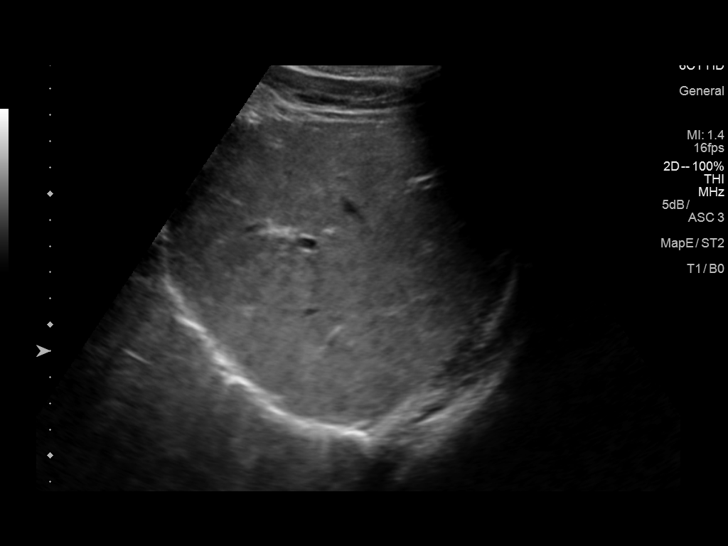
[im 37/97]
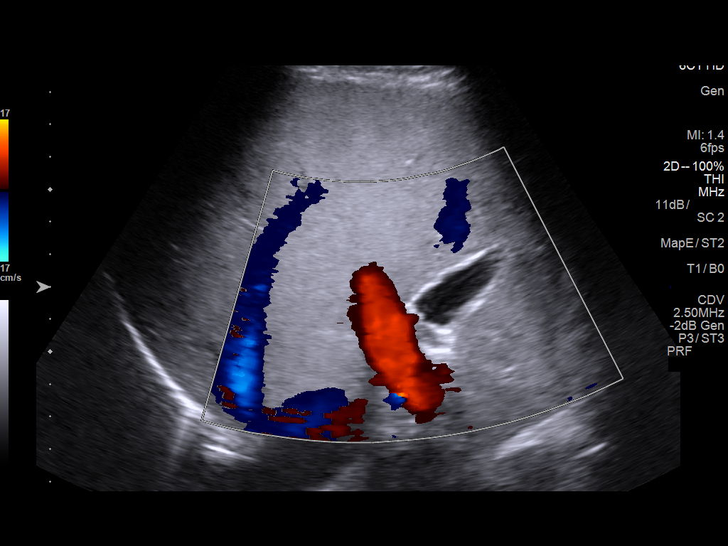
[im 45/97]
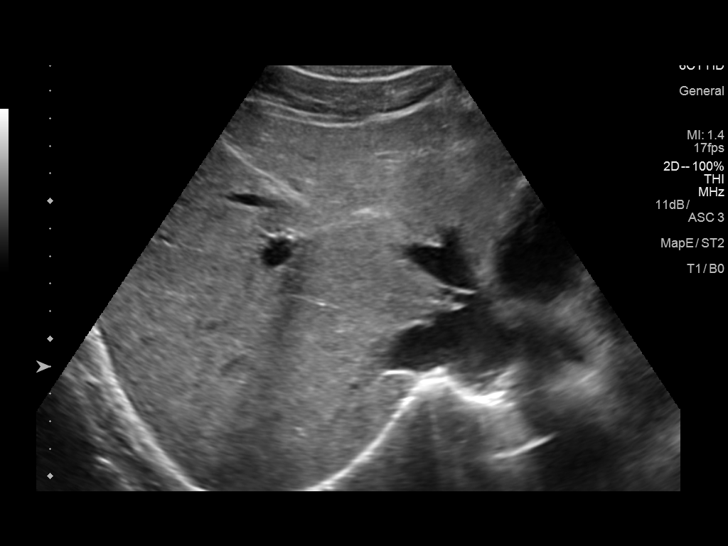
[im 53/97]
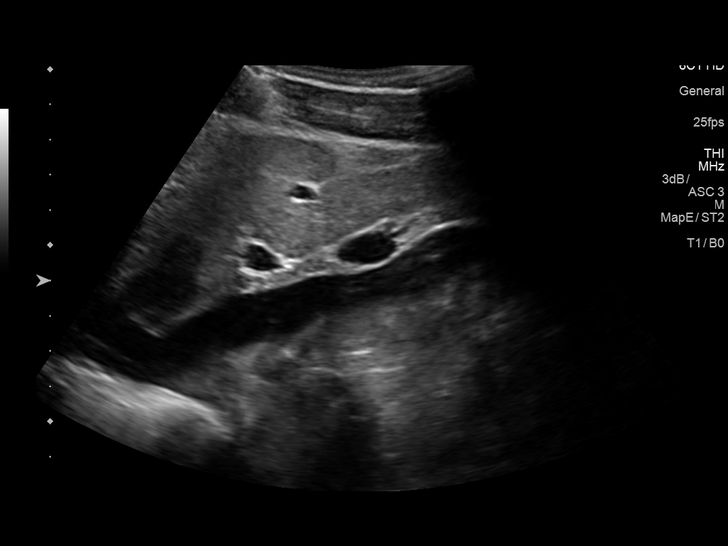
[im 61/97]
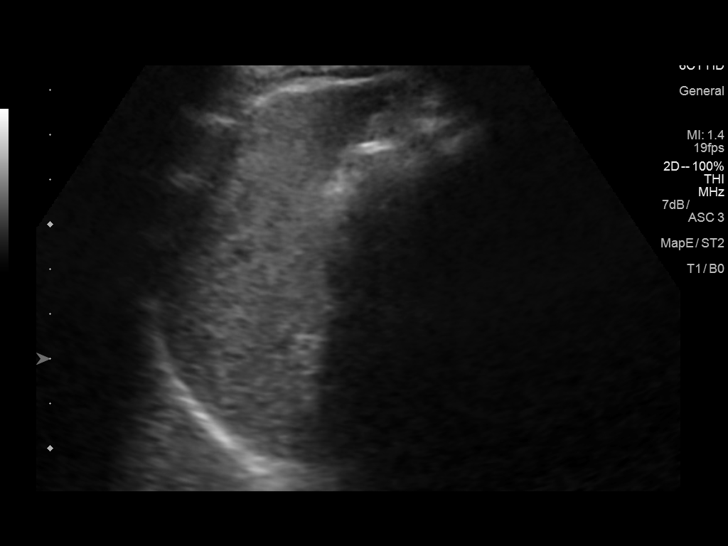
[im 65/97]
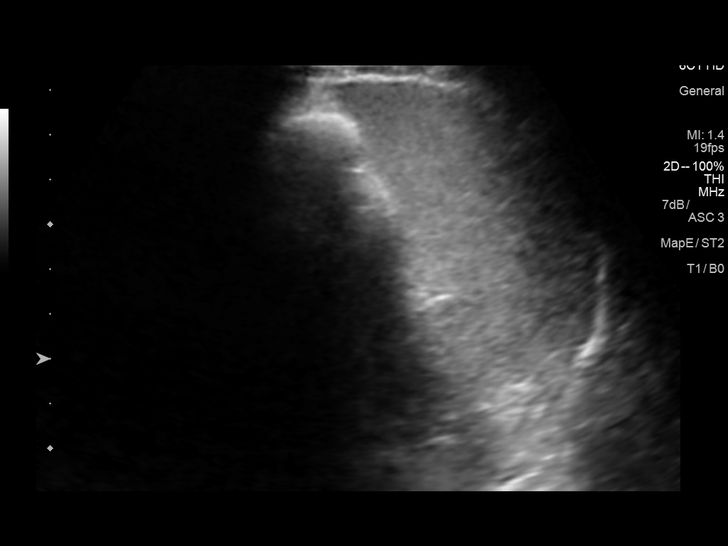
[im 73/97]
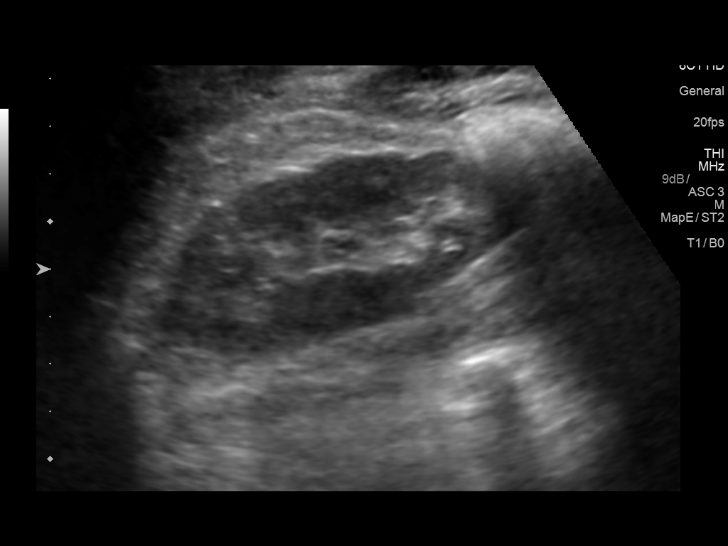
[im 81/97]
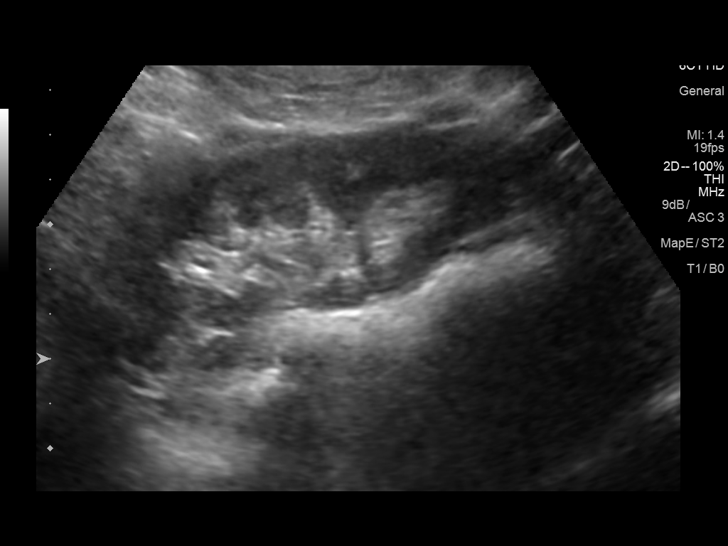
[im 89/97]
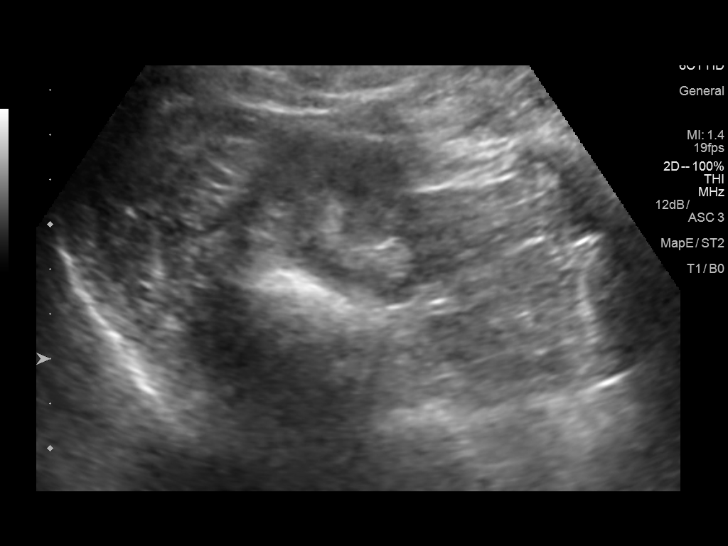
[im 97/97]
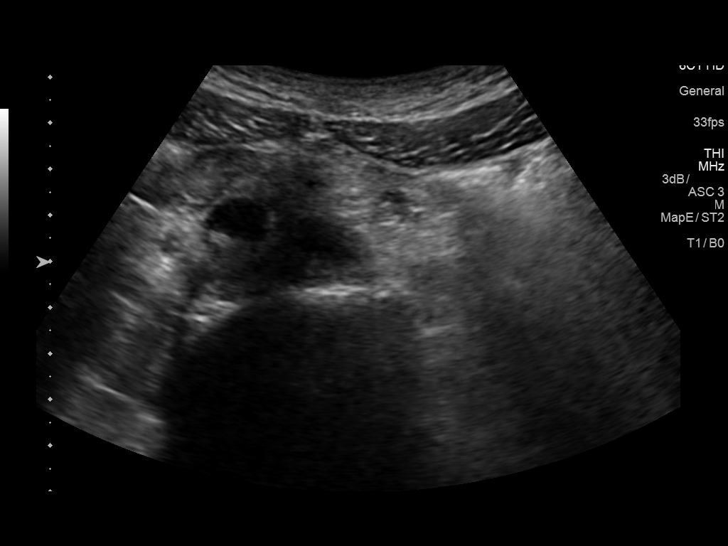

[14 of 25 positions shown; findings below may reference images not displayed]

FINDINGS: Gallbladder: No gallstones or wall thickening visualized. No
sonographic Murphy sign noted by sonographer.

Common bile duct: Diameter: 0.3 cm

Liver: No focal lesion identified. Within normal limits in
parenchymal echogenicity. Portal vein is patent on color Doppler
imaging with normal direction of blood flow towards the liver.

IVC: No abnormality visualized.

Pancreas: Visualized portion unremarkable.

Spleen: Size and appearance within normal limits.

Right Kidney: Length: 9.7 cm. Echogenicity within normal limits. No
mass or hydronephrosis visualized.

Left Kidney: Length: 11.2 cm. Echogenicity within normal limits. No
mass or hydronephrosis visualized.

Abdominal aorta: No aneurysm visualized.

Other findings: None.
IMPRESSION: Normal abdominal ultrasound.

## 2019-06-19 DIAGNOSIS — D2271 Melanocytic nevi of right lower limb, including hip: Secondary | ICD-10-CM | POA: Diagnosis not present

## 2019-06-19 DIAGNOSIS — D2261 Melanocytic nevi of right upper limb, including shoulder: Secondary | ICD-10-CM | POA: Diagnosis not present

## 2019-06-19 DIAGNOSIS — D2262 Melanocytic nevi of left upper limb, including shoulder: Secondary | ICD-10-CM | POA: Diagnosis not present

## 2019-06-19 DIAGNOSIS — D225 Melanocytic nevi of trunk: Secondary | ICD-10-CM | POA: Diagnosis not present

## 2019-06-21 ENCOUNTER — Other Ambulatory Visit: Payer: Self-pay | Admitting: *Deleted

## 2019-06-21 ENCOUNTER — Telehealth: Payer: Self-pay | Admitting: Family Medicine

## 2019-06-21 MED ORDER — PANTOPRAZOLE SODIUM 40 MG PO TBEC: DELAYED_RELEASE_TABLET | ORAL | 3 refills | Status: DC

## 2019-06-21 NOTE — Telephone Encounter (Signed)
Patient is requesting his medication be sent over to a new pharamacy Pantoprazole  [facility]  Patient stated he has about 2 weeks left but wanted to go ahead and request the new pharmacy

## 2019-06-21 NOTE — Telephone Encounter (Signed)
Refill sent to [facility]

## 2019-11-18 ENCOUNTER — Other Ambulatory Visit: Payer: Self-pay | Admitting: Family Medicine

## 2019-11-18 DIAGNOSIS — Z8639 Personal history of other endocrine, nutritional and metabolic disease: Secondary | ICD-10-CM

## 2019-11-18 DIAGNOSIS — Z125 Encounter for screening for malignant neoplasm of prostate: Secondary | ICD-10-CM

## 2019-11-21 ENCOUNTER — Other Ambulatory Visit: Payer: BLUE CROSS/BLUE SHIELD

## 2019-11-23 ENCOUNTER — Encounter: Payer: BLUE CROSS/BLUE SHIELD | Admitting: Family Medicine

## 2019-11-28 ENCOUNTER — Other Ambulatory Visit (INDEPENDENT_AMBULATORY_CARE_PROVIDER_SITE_OTHER): Payer: BC Managed Care – PPO

## 2019-11-28 DIAGNOSIS — Z8639 Personal history of other endocrine, nutritional and metabolic disease: Secondary | ICD-10-CM

## 2019-11-28 DIAGNOSIS — Z125 Encounter for screening for malignant neoplasm of prostate: Secondary | ICD-10-CM

## 2019-11-28 LAB — COMPREHENSIVE METABOLIC PANEL
ALT: 14 U/L (ref 0–53)
AST: 24 U/L (ref 0–37)
Albumin: 4.4 g/dL (ref 3.5–5.2)
Alkaline Phosphatase: 72 U/L (ref 39–117)
BUN: 19 mg/dL (ref 6–23)
CO2: 30 mEq/L (ref 19–32)
Calcium: 9.6 mg/dL (ref 8.4–10.5)
Chloride: 103 mEq/L (ref 96–112)
Creatinine, Ser: 1.07 mg/dL (ref 0.40–1.50)
GFR: 70.41 mL/min (ref >60.00)
Glucose, Bld: 96 mg/dL (ref 70–99)
Potassium: 4.3 mEq/L (ref 3.5–5.1)
Sodium: 139 mEq/L (ref 135–145)
Total Bilirubin: 1.1 mg/dL (ref 0.2–1.2)
Total Protein: 6.8 g/dL (ref 6.0–8.3)

## 2019-11-28 LAB — LIPID PANEL
Cholesterol: 156 mg/dL (ref 0–200)
HDL: 53.7 mg/dL (ref >39.00)
LDL Cholesterol: 95 mg/dL (ref 0–99)
NonHDL: 102.74
Total CHOL/HDL Ratio: 3
Triglycerides: 39 mg/dL (ref 0.0–149.0)
VLDL: 7.8 mg/dL (ref 0.0–40.0)

## 2019-11-28 LAB — PSA: PSA: 0.44 ng/mL (ref 0.10–4.00)

## 2019-12-04 ENCOUNTER — Encounter: Payer: BC Managed Care – PPO | Admitting: Family Medicine

## 2019-12-08 DIAGNOSIS — S91105A Unspecified open wound of left lesser toe(s) without damage to nail, initial encounter: Secondary | ICD-10-CM | POA: Diagnosis not present

## 2019-12-20 ENCOUNTER — Ambulatory Visit (INDEPENDENT_AMBULATORY_CARE_PROVIDER_SITE_OTHER): Payer: BC Managed Care – PPO | Admitting: Family Medicine

## 2019-12-20 ENCOUNTER — Encounter: Payer: Self-pay | Admitting: Family Medicine

## 2019-12-20 ENCOUNTER — Other Ambulatory Visit: Payer: Self-pay

## 2019-12-20 VITALS — BP 104/70 | HR 60 | Temp 97.4°F | Ht 72.0 in | Wt 160.6 lb

## 2019-12-20 DIAGNOSIS — Z0000 Encounter for general adult medical examination without abnormal findings: Secondary | ICD-10-CM

## 2019-12-20 DIAGNOSIS — K222 Esophageal obstruction: Secondary | ICD-10-CM

## 2019-12-20 DIAGNOSIS — Z7189 Other specified counseling: Secondary | ICD-10-CM

## 2019-12-20 NOTE — Progress Notes (Signed)
This visit occurred during the SARS-CoV-2 public health emergency.  Safety protocols were in place, including screening questions prior to the visit, additional usage of staff PPE, and extensive cleaning of exam room while observing appropriate contact time as indicated for disinfecting solutions.  CPE- See plan.  Routine anticipatory guidance given to patient.  See health maintenance.  The possibility exists that previously documented standard health maintenance information may have been brought forward from a previous encounter into this note.  If needed, that same information has been updated to reflect the current situation based on today's encounter.    Tdap2019 Flu shot encouraged Shingles d/w pt.  PNA due at 63 covid vaccine 2021.   Colonoscopy done 2020 PSA wnl.  Diet and exercise- doing well on both.  Labs d/w pt.  Living will d/w pt. Son designated if patient were incapacitated.  HIV and HCV neg prev.  Labs discussed with patient.  Stitches in L 3rd toe, placed 12 days ago.  Slipped on the back of the boat and cut his toe. Tdap 2019.  He was seen at an urgent care outside of town and had repair done.  No drainage or troubles in the meantime.  Swallowing well.  Still on PPI.  Compliant.  He was occ lightheaded on standing but better with inc PO intake.  Not noted every time upon standing.  No vertigo.  No syncope.  D/w pt inc fluids and okay to liberalize salt.    He can update me as needed.  He agrees.    He is working from home due to pandemic.  That eliminates his commute but his work demands increased.    PMH and SH reviewed  Meds, vitals, and allergies reviewed.   ROS: Per HPI.  Unless specifically indicated otherwise in HPI, the patient denies:  General: fever. Eyes: acute vision changes ENT: sore throat Cardiovascular: chest pain Respiratory: SOB GI: vomiting GU: dysuria Musculoskeletal: acute back pain Derm: acute rash Neuro: acute motor  dysfunction Psych: worsening mood Endocrine: polydipsia Heme: bleeding Allergy: hayfever  GEN: nad, alert and oriented HEENT: NCAT NECK: supple w/o LA CV: rrr. PULM: ctab, no inc wob ABD: soft, +bs EXT: no edema SKIN: no acute rash  L 3rd toe with healed laceration on plantar side and all 7 stitches removed easily without complication and still with good tissue apposition.  Normal range of motion and routine instructions given to patient.

## 2019-12-20 NOTE — Patient Instructions (Addendum)
Check with your insurance to see if they will cover shingrix. Update me as needed.  Take care.  Glad to see you. Thanks for getting a covid vaccine.

## 2019-12-23 ENCOUNTER — Encounter: Payer: Self-pay | Admitting: Family Medicine

## 2019-12-23 NOTE — Assessment & Plan Note (Signed)
Tdap2019 Flu shot encouraged Shingles d/w pt.  PNA due at 63 covid vaccine 2021.   Colonoscopy done 2020 PSA wnl.  Diet and exercise- doing well on both.  Labs d/w pt.  Living will d/w pt. Son designated if patient were incapacitated.  HIV and HCV neg prev.  Stitches in L 3rd toe, placed 12 days ago.  Tdap 2019.  See above.  Sutures removed without complications and routine instructions given to patient  He was occ lightheaded on standing but better with inc PO intake.  Not noted every time upon standing.  No vertigo.  No syncope.  D/w pt inc fluids and okay to liberalize salt.    He can update me as needed.  He agrees.

## 2019-12-23 NOTE — Assessment & Plan Note (Signed)
Swallowing well.  Still on PPI.  Compliant.  Continue as is.  He agrees.

## 2019-12-23 NOTE — Assessment & Plan Note (Signed)
Living will d/w pt.  Son designated if patient were incapacitated.   

## 2020-10-09 ENCOUNTER — Other Ambulatory Visit: Payer: Self-pay | Admitting: Family Medicine

## 2020-12-17 ENCOUNTER — Other Ambulatory Visit: Payer: Self-pay | Admitting: Family Medicine

## 2020-12-17 DIAGNOSIS — Z125 Encounter for screening for malignant neoplasm of prostate: Secondary | ICD-10-CM

## 2020-12-17 DIAGNOSIS — Z8639 Personal history of other endocrine, nutritional and metabolic disease: Secondary | ICD-10-CM

## 2020-12-18 ENCOUNTER — Other Ambulatory Visit (INDEPENDENT_AMBULATORY_CARE_PROVIDER_SITE_OTHER): Payer: BC Managed Care – PPO

## 2020-12-18 ENCOUNTER — Other Ambulatory Visit: Payer: Self-pay

## 2020-12-18 DIAGNOSIS — Z125 Encounter for screening for malignant neoplasm of prostate: Secondary | ICD-10-CM | POA: Diagnosis not present

## 2020-12-18 DIAGNOSIS — Z8639 Personal history of other endocrine, nutritional and metabolic disease: Secondary | ICD-10-CM

## 2020-12-18 LAB — PSA: PSA: 0.48 ng/mL (ref 0.10–4.00)

## 2020-12-19 LAB — COMPREHENSIVE METABOLIC PANEL
ALT: 15 U/L (ref 0–53)
AST: 26 U/L (ref 0–37)
Albumin: 4.5 g/dL (ref 3.5–5.2)
Alkaline Phosphatase: 73 U/L (ref 39–117)
BUN: 23 mg/dL (ref 6–23)
CO2: 27 mEq/L (ref 19–32)
Calcium: 9.7 mg/dL (ref 8.4–10.5)
Chloride: 104 mEq/L (ref 96–112)
Creatinine, Ser: 1.15 mg/dL (ref 0.40–1.50)
GFR: 68.73 mL/min (ref >60.00)
Glucose, Bld: 95 mg/dL (ref 70–99)
Potassium: 4.3 mEq/L (ref 3.5–5.1)
Sodium: 142 mEq/L (ref 135–145)
Total Bilirubin: 0.8 mg/dL (ref 0.2–1.2)
Total Protein: 7 g/dL (ref 6.0–8.3)

## 2020-12-19 LAB — LIPID PANEL
Cholesterol: 169 mg/dL (ref 0–200)
HDL: 62.7 mg/dL (ref >39.00)
LDL Cholesterol: 99 mg/dL (ref 0–99)
NonHDL: 106.7
Total CHOL/HDL Ratio: 3
Triglycerides: 40 mg/dL (ref 0.0–149.0)
VLDL: 8 mg/dL (ref 0.0–40.0)

## 2020-12-25 ENCOUNTER — Encounter: Payer: Self-pay | Admitting: Family Medicine

## 2020-12-25 ENCOUNTER — Other Ambulatory Visit: Payer: Self-pay

## 2020-12-25 ENCOUNTER — Ambulatory Visit (INDEPENDENT_AMBULATORY_CARE_PROVIDER_SITE_OTHER): Payer: BC Managed Care – PPO | Admitting: Family Medicine

## 2020-12-25 VITALS — BP 98/70 | HR 62 | Temp 97.0°F | Ht 72.0 in | Wt 157.3 lb

## 2020-12-25 DIAGNOSIS — M79606 Pain in leg, unspecified: Secondary | ICD-10-CM

## 2020-12-25 DIAGNOSIS — K219 Gastro-esophageal reflux disease without esophagitis: Secondary | ICD-10-CM

## 2020-12-25 DIAGNOSIS — R5383 Other fatigue: Secondary | ICD-10-CM | POA: Diagnosis not present

## 2020-12-25 DIAGNOSIS — Z0000 Encounter for general adult medical examination without abnormal findings: Secondary | ICD-10-CM

## 2020-12-25 DIAGNOSIS — Z7189 Other specified counseling: Secondary | ICD-10-CM

## 2020-12-25 LAB — CBC WITH DIFFERENTIAL/PLATELET
Basophils Absolute: 0 10*3/uL (ref 0.0–0.1)
Basophils Relative: 0.3 % (ref 0.0–3.0)
Eosinophils Absolute: 0.2 10*3/uL (ref 0.0–0.7)
Eosinophils Relative: 3.3 % (ref 0.0–5.0)
HCT: 40 % (ref 39.0–52.0)
Hemoglobin: 13.6 g/dL (ref 13.0–17.0)
Lymphocytes Relative: 26.5 % (ref 12.0–46.0)
Lymphs Abs: 1.4 10*3/uL (ref 0.7–4.0)
MCHC: 34.1 g/dL (ref 30.0–36.0)
MCV: 89.7 fl (ref 78.0–100.0)
Monocytes Absolute: 0.6 10*3/uL (ref 0.1–1.0)
Monocytes Relative: 10.1 % (ref 3.0–12.0)
Neutro Abs: 3.3 10*3/uL (ref 1.4–7.7)
Neutrophils Relative %: 59.8 % (ref 43.0–77.0)
Platelets: 182 10*3/uL (ref 150.0–400.0)
RBC: 4.46 Mil/uL (ref 4.22–5.81)
RDW: 13 % (ref 11.5–15.5)
WBC: 5.5 10*3/uL (ref 4.0–10.5)

## 2020-12-25 LAB — TSH: TSH: 1.07 u[IU]/mL (ref 0.35–4.50)

## 2020-12-25 MED ORDER — PANTOPRAZOLE SODIUM 40 MG PO TBEC: DELAYED_RELEASE_TABLET | ORAL | 3 refills | Status: DC

## 2020-12-25 NOTE — Patient Instructions (Addendum)
Limit running for 1 month then gradually ease back.  Update me as needed.  Straight leg raises, externally rotated leg raises, and side leg lifts- no weights,  start with sets of 10 and gradually inc to 3 sets.  Take care.  Glad to see you.

## 2020-12-25 NOTE — Progress Notes (Signed)
This visit occurred during the SARS-CoV-2 public health emergency.  Safety protocols were in place, including screening questions prior to the visit, additional usage of staff PPE, and extensive cleaning of exam room while observing appropriate contact time as indicated for disinfecting solutions  CPE- See plan.  Routine anticipatory guidance given to patient.  See health maintenance.  The possibility exists that previously documented standard health maintenance information may have been brought forward from a previous encounter into this note.  If needed, that same information has been updated to reflect the current situation based on today's encounter.    Tdap 2019 Flu shot encouraged Shingles d/w pt. PNA due at 63 covid vaccine 2021.   Colonoscopy done 2020 PSA wnl. Diet and exercise- doing well on both. Labs d/w pt. Living will d/w pt. Son designated if patient were incapacitated. HIV and HCV neg prev.    Some fatigue noted.  Some hair loss noted.  F/u labs pending.  See notes on labs.  He is still able to exercise.   He was occ lightheaded on standing but better with inc PO intake.  Not noted every time upon standing.  No vertigo.  No syncope.  D/w pt inc fluids and okay to liberalize salt.    He can update me as needed.  He agrees.    GERD controlled.  Still on 1/2-1 tab PPI per day.  No ADE on med.    L lower eye lid with retained lash, easily removed with q tip w/o incident and he feels better.    Grandchild born this year, father in law moved to memory care.  Work situation d/w pt.  R shoulder pain noted lifting weights in November, better in the meantime.  Likely hamstring strain last year, can radiate up into the L medial groin.  Noted after running.  He is stretching at baseline.  It got better with laying off running.  No sx with biking.  D/w pt about trial off running with biking and swimming.  If not better, then I asked him to update me.  He agreed.  PMH and SH  reviewed  Meds, vitals, and allergies reviewed.   ROS: Per HPI.  Unless specifically indicated otherwise in HPI, the patient denies:  General: fever. Eyes: acute vision changes ENT: sore throat Cardiovascular: chest pain Respiratory: SOB GI: vomiting GU: dysuria Musculoskeletal: acute back pain Derm: acute rash Neuro: acute motor dysfunction Psych: worsening mood Endocrine: polydipsia Heme: bleeding Allergy: hayfever  GEN: nad, alert and oriented HEENT: ncat, normal conjunctiva inspection and EOMI but retained lash noted on the left lower lid, easily removed as above.  No complication.  Tolerated well.  He felt better. NECK: supple w/o LA CV: rrr. PULM: ctab, no inc wob ABD: soft, +bs EXT: no edema SKIN: no acute rash L hamstring not ttp but weak ITB B Normal ROM R shoulder.

## 2020-12-28 DIAGNOSIS — R5383 Other fatigue: Secondary | ICD-10-CM | POA: Insufficient documentation

## 2020-12-28 DIAGNOSIS — M79606 Pain in leg, unspecified: Secondary | ICD-10-CM | POA: Insufficient documentation

## 2020-12-28 NOTE — Assessment & Plan Note (Signed)
Occasional fatigue noted but still able to exercise.  See notes on follow-up labs.

## 2020-12-28 NOTE — Assessment & Plan Note (Signed)
Living will d/w pt.  Son designated if patient were incapacitated.   

## 2020-12-28 NOTE — Assessment & Plan Note (Signed)
Likely hamstring strain last year, can radiate up into the L medial groin.  Noted after running.  He is stretching at baseline.  It got better with laying off running.  No sx with biking.  D/w pt about trial off running with biking and swimming.  If not better, then I asked him to update me.  He agreed.  We talked about straight leg raises versus externally rotated straight leg raises versus side leg lifts to help with ITB, in case that could potentially contribute.

## 2020-12-28 NOTE — Assessment & Plan Note (Signed)
  GERD controlled.  Still on 1/2-1 tab PPI per day.  No ADE on med.

## 2020-12-28 NOTE — Assessment & Plan Note (Signed)
Tdap 2019 Flu shot encouraged Shingles d/w pt. PNA due at 63 covid vaccine 2021.   Colonoscopy done 2020 PSA wnl. Diet and exercise- doing well on both. Labs d/w pt. Living will d/w pt. Son designated if patient were incapacitated. HIV and HCV neg prev.

## 2021-03-12 ENCOUNTER — Encounter: Payer: Self-pay | Admitting: Family Medicine

## 2021-03-12 ENCOUNTER — Other Ambulatory Visit: Payer: Self-pay

## 2021-03-12 ENCOUNTER — Ambulatory Visit (INDEPENDENT_AMBULATORY_CARE_PROVIDER_SITE_OTHER): Payer: BC Managed Care – PPO | Admitting: Family Medicine

## 2021-03-12 VITALS — BP 110/74 | HR 62 | Temp 98.4°F | Ht 72.0 in | Wt 160.5 lb

## 2021-03-12 DIAGNOSIS — N411 Chronic prostatitis: Secondary | ICD-10-CM

## 2021-03-12 DIAGNOSIS — N5089 Other specified disorders of the male genital organs: Secondary | ICD-10-CM

## 2021-03-12 MED ORDER — CIPROFLOXACIN HCL 500 MG PO TABS: 500.0000 mg | ORAL_TABLET | Freq: Two times a day (BID) | ORAL | 0 refills | Status: DC

## 2021-03-12 NOTE — Progress Notes (Signed)
[name] [name], MD, CAQ Sports Medicine [name] [address] [place], [address]  Phone: [phone]  FAX: [phone]  Thomas Welch - 60 y.o. male  MRN [id number]  Date of Birth: [birth year]  Date: 03/12/2021  PCP: Joaquim Nam, MD  Referral: Joaquim Nam, MD  Chief Complaint  Patient presents with   Testicle Pain    More Pressure that Pain    This visit occurred during the SARS-CoV-2 public health emergency.  Safety protocols were in place, including screening questions prior to the visit, additional usage of staff PPE, and extensive cleaning of exam room while observing appropriate contact time as indicated for disinfecting solutions.   Subjective:   Thomas Welch is a 60 y.o. very pleasant male patient with Body mass index is 21.77 kg/m. who presents with the following:  Testicle pain:   Runs in the summer Then got tight with posterior of the l leg.  Stopped and then it got better over the winter   Started running again this year.  Stopped a few weeks ago.  Feels pressure.  No severe pain, no radiculopathy or significant back pain. This did improve with rest.  Varicosities -  Noticed a bump on the testicle.  Spot that is hard to identify. L testicle - spot on on the caudle aspect.  He also describes deep, dull pain in the peroneal region.  Check a scrotal ultrasound    Review of Systems is noted in the HPI, as appropriate  Objective:   BP 110/74   Pulse 62   Temp 98.4 F (36.9 C) (Temporal)   Ht 6' (1.829 m)   Wt 160 lb 8 oz (72.8 kg)   SpO2 98%   BMI 21.77 kg/m   GEN: No acute distress; alert,appropriate. PULM: Breathing comfortably in no respiratory distress PSYCH: Normally interactive.   GU: L proximal testicle with palpable nodule / mass approx just below 1 cm in size No hernia  Laboratory and Imaging Data:  Assessment and Plan:     ICD-10-CM   1. Testicular mass  N50.89 US  SCROTUM W/DOPPLER    2. Chronic prostatitis  N41.1      I do palpate a mass as above, and [place] needed to assess further.   ? Prostatitis, and if sx resolve with ABX, then primary issue likely this.  Meds ordered this encounter  Medications   ciprofloxacin (CIPRO) 500 MG tablet    Sig: Take 1 tablet (500 mg total) by mouth 2 (two) times daily.    Dispense:  28 tablet    Refill:  0   There are no discontinued medications. Orders Placed This Encounter  Procedures   US SCROTUM W/DOPPLER    Follow-up: No follow-ups on file.  Dragon [employer] One speech-to-text software was used for transcription in this dictation.  Possible transcriptional errors can occur using Animal nutritionist.   Signed,  Elpidio Galea. Trinetta Alemu, MD   Outpatient Encounter Medications as of 03/12/2021  Medication Sig   ciprofloxacin (CIPRO) 500 MG tablet Take 1 tablet (500 mg total) by mouth 2 (two) times daily.   cyanocobalamin 1000 MCG tablet Take 1,000 mcg by mouth. Once a week   pantoprazole (PROTONIX) 40 MG tablet TAKE 0.5-1 TABLET BY MOUTH DAILY   Probiotic Product (ALOE 08676 & PROBIOTICS PO) Take by mouth.   TURMERIC PO Take by mouth.   No facility-administered encounter medications on file as of 03/12/2021.

## 2021-03-18 ENCOUNTER — Ambulatory Visit
Admission: RE | Admit: 2021-03-18 | Discharge: 2021-03-18 | Disposition: A | Discharge status: Home / Self Care | Payer: BC Managed Care – PPO | Source: Ambulatory Visit | Attending: Family Medicine | Admitting: Family Medicine

## 2021-03-18 DIAGNOSIS — N5089 Other specified disorders of the male genital organs: Secondary | ICD-10-CM

## 2021-03-24 NOTE — Telephone Encounter (Signed)
Addressed in prior note - results.

## 2021-07-24 ENCOUNTER — Telehealth: Payer: BLUE CROSS/BLUE SHIELD | Admitting: Emergency Medicine

## 2021-07-24 DIAGNOSIS — J029 Acute pharyngitis, unspecified: Secondary | ICD-10-CM | POA: Diagnosis not present

## 2021-07-24 MED ORDER — AMOXICILLIN 500 MG PO CAPS: 500.0000 mg | ORAL_CAPSULE | Freq: Three times a day (TID) | ORAL | 0 refills | Status: AC

## 2021-07-24 NOTE — Progress Notes (Signed)
Virtual Visit Consent   Thomas Welch, you are scheduled for a virtual visit with a [facility] provider today.     Just as with appointments in the office, your consent must be obtained to participate.  Your consent will be active for this visit and any virtual visit you may have with one of our providers in the next 365 days.     If you have a MyChart account, a copy of this consent can be sent to you electronically.  All virtual visits are billed to your insurance company just like a traditional visit in the office.    As this is a virtual visit, video technology does not allow for your provider to perform a traditional examination.  This may limit your provider's ability to fully assess your condition.  If your provider identifies any concerns that need to be evaluated in person or the need to arrange testing (such as labs, EKG, etc.), we will make arrangements to do so.     Although advances in technology are sophisticated, we cannot ensure that it will always work on either your end or our end.  If the connection with a video visit is poor, the visit may have to be switched to a telephone visit.  With either a video or telephone visit, we are not always able to ensure that we have a secure connection.     I need to obtain your verbal consent now.   Are you willing to proceed with your visit today?    Thomas Welch has provided verbal consent on 07/24/2021 for a virtual visit video.   Thomas Horseman, PA-C   Date: 07/24/2021 2:00 PM   Virtual Visit via Video Note   I, Thomas Welch, connected with  Thomas Welch  ([id number], 1961-01-05) on 07/24/21 at  2:00 PM EST by a video-enabled telemedicine application and verified that I am speaking with the correct person using two identifiers.  Location: Patient: Virtual Visit Location Patient: Home Provider: Virtual Visit Location Provider: Home Office   I discussed the limitations of evaluation and management by telemedicine and the  availability of in person appointments. The patient expressed understanding and agreed to proceed.    History of Present Illness: Thomas Welch is a 61 y.o. who identifies as a male who was assigned male at birth, and is being seen today for cold, sore throat.  Has tried OTC meds.  Onset was about 3 weeks ago.  Has had a negative home COVID test.  Denies fever.    HPI: HPI  Problems:  Patient Active Problem List   Diagnosis Date Noted   Other fatigue 12/28/2020   Leg pain 12/28/2020   Problems with swallowing and mastication    GERD (gastroesophageal reflux disease) 06/05/2017   Advance care planning 11/04/2014   FH: prostate cancer 11/02/2013   Routine general medical examination at a health care facility 07/01/2011   Stricture and stenosis of esophagus 07/13/2007   History of hyperglycemia 05/12/2007    Allergies: No Known Allergies Medications:  Current Outpatient Medications:    ciprofloxacin (CIPRO) 500 MG tablet, Take 1 tablet (500 mg total) by mouth 2 (two) times daily., Disp: 28 tablet, Rfl: 0   cyanocobalamin 1000 MCG tablet, Take 1,000 mcg by mouth. Once a week, Disp: , Rfl:    pantoprazole (PROTONIX) 40 MG tablet, TAKE 0.5-1 TABLET BY MOUTH DAILY, Disp: 90 tablet, Rfl: 3   Probiotic Product (ALOE 85277 & PROBIOTICS PO), Take by mouth., Disp: ,  Rfl:    TURMERIC PO, Take by mouth., Disp: , Rfl:   Observations/Objective: Patient is well-developed, well-nourished in no acute distress.  Resting comfortably at home.  Head is normocephalic, atraumatic.  No labored breathing.  Speech is clear and coherent with logical content.  Patient is alert and oriented at baseline.    Assessment and Plan: 1. Pharyngitis, unspecified etiology  -Trial amox  Follow Up Instructions: I discussed the assessment and treatment plan with the patient. The patient was provided an opportunity to ask questions and all were answered. The patient agreed with the plan and demonstrated an  understanding of the instructions.  A copy of instructions were sent to the patient via MyChart unless otherwise noted below.     The patient was advised to call back or seek an in-person evaluation if the symptoms worsen or if the condition fails to improve as anticipated.  Time:  I spent 9 minutes with the patient via telehealth technology discussing the above problems/concerns.    Thomas Horseman, PA-C

## 2021-09-18 IMAGING — US US SCROTUM W/ DOPPLER COMPLETE
1 series · 13 of 25 positions shown · non-contrast
Comparison: None.

CLINICAL DATA: 59-year-old male with left testicular mass

EXAM:
SCROTAL ULTRASOUND
DOPPLER ULTRASOUND OF THE TESTICLES
TECHNIQUE: Complete ultrasound examination of the testicles, epididymis, and
other scrotal structures was performed. Color and spectral Doppler
ultrasound were also utilized to evaluate blood flow to the
testicles.

[Series 1: us scrotum w/ doppler complete · 0.05mm/px · 13 of 62 slices shown]
[im 1/62]
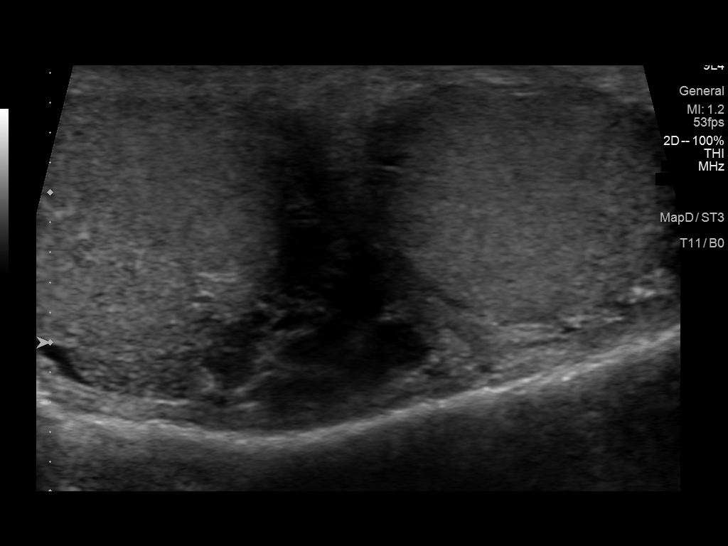
[im 6/62]
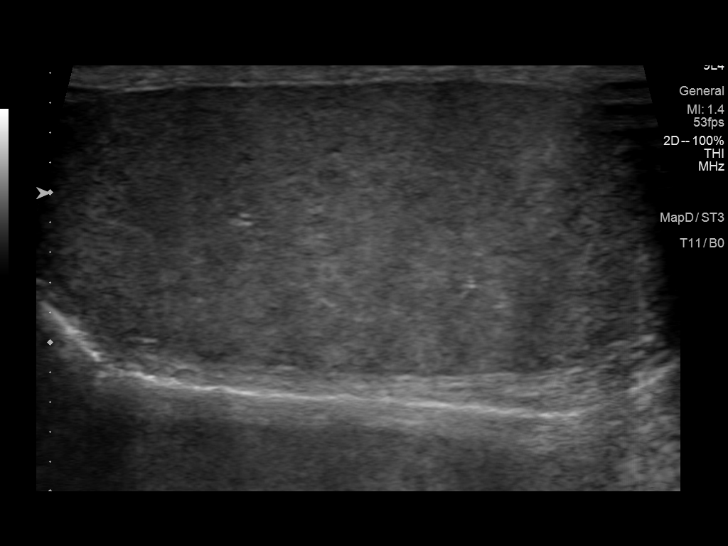
[im 11/62]
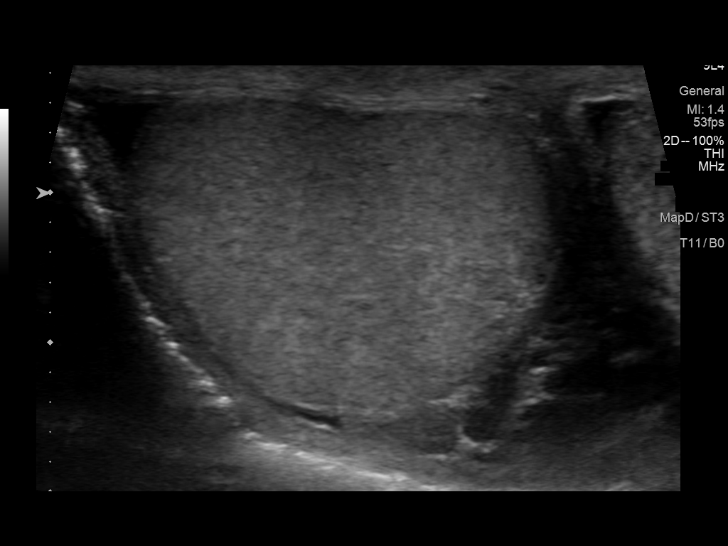
[im 16/62]
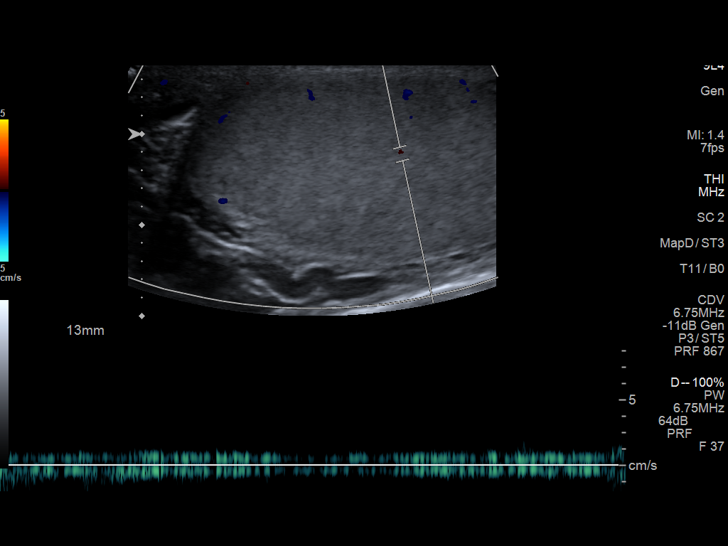
[im 21/62]
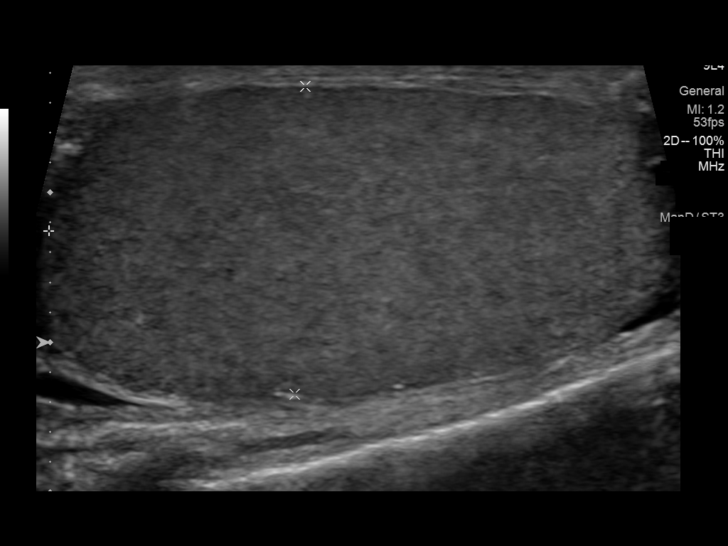
[im 26/62]
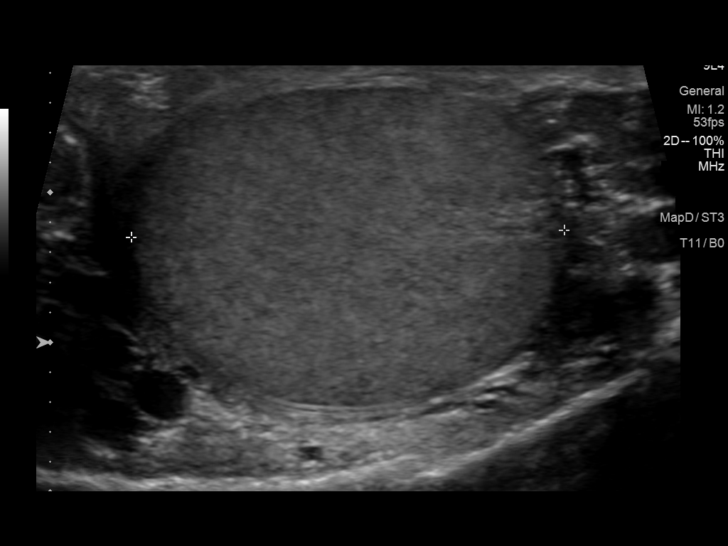
[im 31/62]
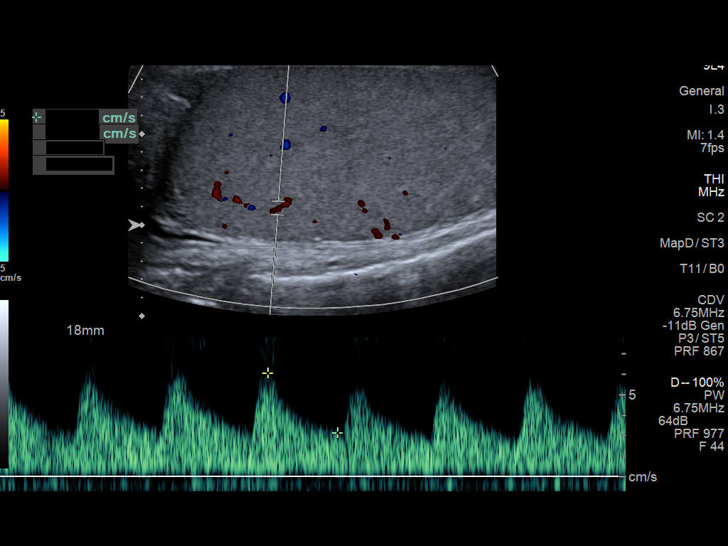
[im 36/62]
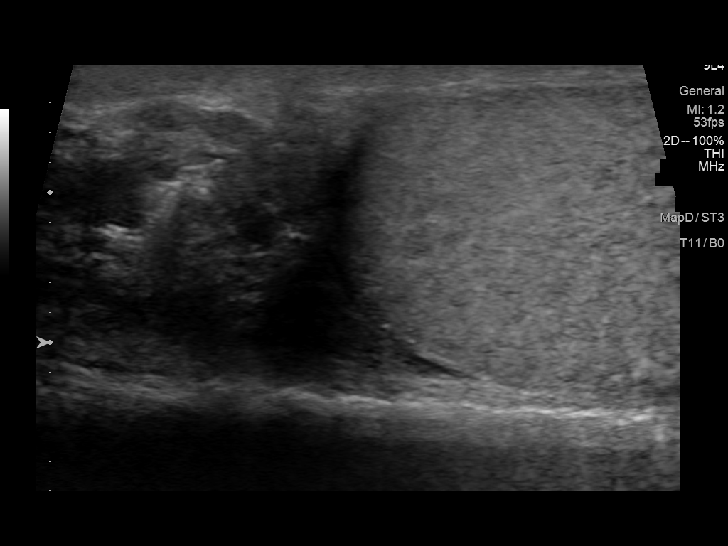
[im 41/62]
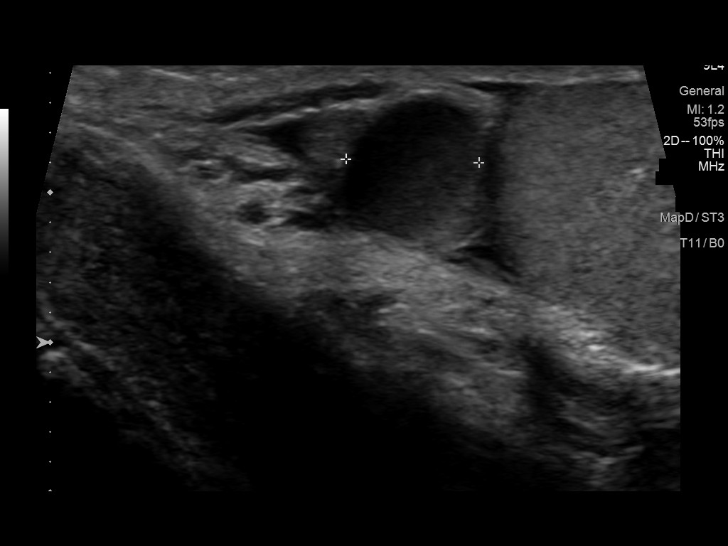
[im 46/62]
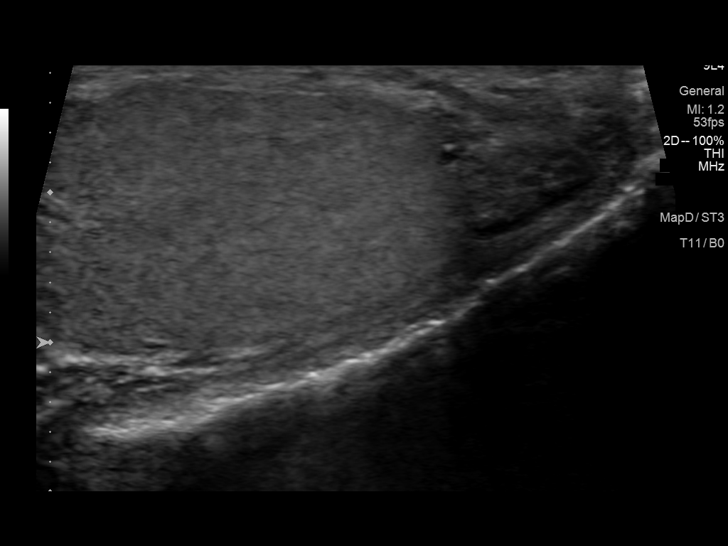
[im 51/62]
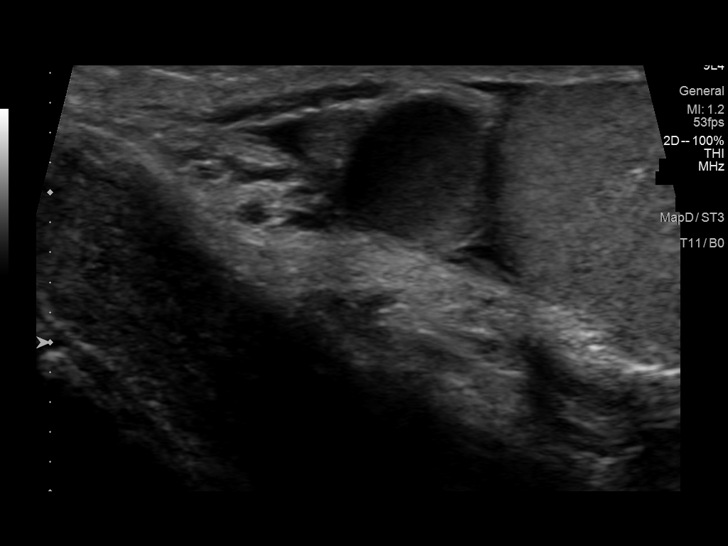
[im 56/62]
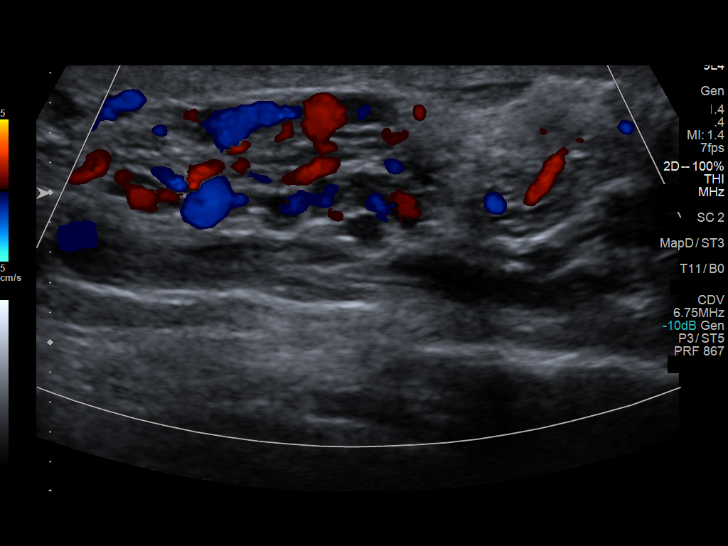
[im 62/62]
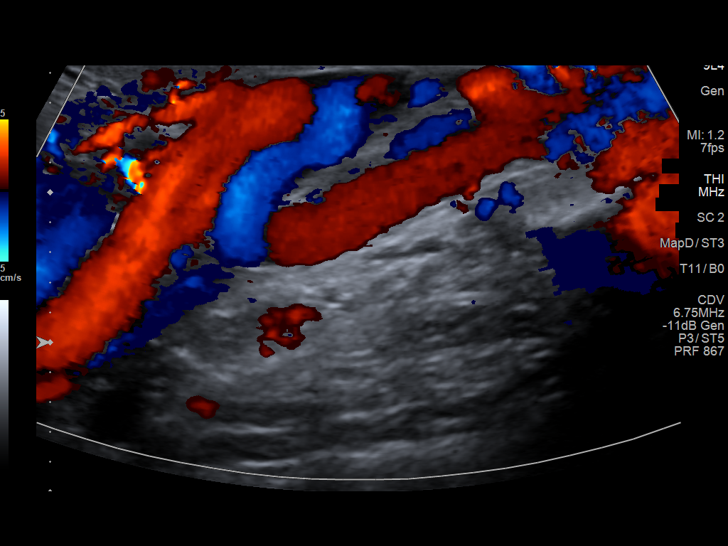

[13 of 25 positions shown; findings below may reference images not displayed]

FINDINGS: Right testicle

Measurements: 4.1 x 1.9 x 2.7 cm. No mass or microlithiasis
visualized.

Left testicle

Measurements: 4.2 x 2.1 x 2.9 cm. No mass or microlithiasis
visualized.

Right epididymis:  Normal in size and appearance.

Left epididymis: The epididymis is normal in size. Circumscribed
hypoechoic cystic structure in the epididymal head measures 0.9 x
1.1 x 1.1 cm.

Hydrocele:  None visualized.

Varicocele: Mild enlargement of the pampiniform venous plexus on
both the right and the left which is exacerbated during Valsalva.
Findings are consistent with bilateral varicoceles.

Pulsed Doppler interrogation of both testes demonstrates normal low
resistance arterial and venous waveforms bilaterally.
IMPRESSION: 1. Approximally 1.1 cm minimally complex cyst within the head of the
left epididymis. This likely accounts for the palpable left
testicular mass. Differential considerations include spermatocele
and epididymal cyst.
2. The testicles themselves are within normal limits without
evidence of mass or abnormality.
3. Small bilateral varicoceles also noted.

## 2021-12-20 ENCOUNTER — Other Ambulatory Visit: Payer: Self-pay | Admitting: Family Medicine

## 2021-12-20 DIAGNOSIS — Z8639 Personal history of other endocrine, nutritional and metabolic disease: Secondary | ICD-10-CM

## 2021-12-20 DIAGNOSIS — Z125 Encounter for screening for malignant neoplasm of prostate: Secondary | ICD-10-CM

## 2021-12-22 ENCOUNTER — Other Ambulatory Visit (INDEPENDENT_AMBULATORY_CARE_PROVIDER_SITE_OTHER): Payer: BC Managed Care – PPO

## 2021-12-22 DIAGNOSIS — Z8639 Personal history of other endocrine, nutritional and metabolic disease: Secondary | ICD-10-CM | POA: Diagnosis not present

## 2021-12-22 DIAGNOSIS — Z125 Encounter for screening for malignant neoplasm of prostate: Secondary | ICD-10-CM | POA: Diagnosis not present

## 2021-12-22 LAB — COMPREHENSIVE METABOLIC PANEL
ALT: 17 U/L (ref 0–53)
AST: 29 U/L (ref 0–37)
Albumin: 4.4 g/dL (ref 3.5–5.2)
Alkaline Phosphatase: 68 U/L (ref 39–117)
BUN: 17 mg/dL (ref 6–23)
CO2: 30 mEq/L (ref 19–32)
Calcium: 9.9 mg/dL (ref 8.4–10.5)
Chloride: 102 mEq/L (ref 96–112)
Creatinine, Ser: 1.04 mg/dL (ref 0.40–1.50)
GFR: 77 mL/min (ref >60.00)
Glucose, Bld: 94 mg/dL (ref 70–99)
Potassium: 4.7 mEq/L (ref 3.5–5.1)
Sodium: 139 mEq/L (ref 135–145)
Total Bilirubin: 1.2 mg/dL (ref 0.2–1.2)
Total Protein: 7 g/dL (ref 6.0–8.3)

## 2021-12-22 LAB — LIPID PANEL
Cholesterol: 169 mg/dL (ref 0–200)
HDL: 62.5 mg/dL (ref >39.00)
LDL Cholesterol: 99 mg/dL (ref 0–99)
NonHDL: 106.64
Total CHOL/HDL Ratio: 3
Triglycerides: 39 mg/dL (ref 0.0–149.0)
VLDL: 7.8 mg/dL (ref 0.0–40.0)

## 2021-12-22 LAB — PSA: PSA: 0.5 ng/mL (ref 0.10–4.00)

## 2021-12-29 ENCOUNTER — Ambulatory Visit (INDEPENDENT_AMBULATORY_CARE_PROVIDER_SITE_OTHER): Payer: BC Managed Care – PPO | Admitting: Family Medicine

## 2021-12-29 ENCOUNTER — Encounter: Payer: Self-pay | Admitting: Family Medicine

## 2021-12-29 ENCOUNTER — Encounter: Payer: BC Managed Care – PPO | Admitting: Family Medicine

## 2021-12-29 VITALS — BP 118/78 | HR 56 | Temp 97.6°F | Ht 72.0 in | Wt 160.0 lb

## 2021-12-29 DIAGNOSIS — K219 Gastro-esophageal reflux disease without esophagitis: Secondary | ICD-10-CM

## 2021-12-29 DIAGNOSIS — Z0000 Encounter for general adult medical examination without abnormal findings: Secondary | ICD-10-CM

## 2021-12-29 DIAGNOSIS — G8929 Other chronic pain: Secondary | ICD-10-CM

## 2021-12-29 DIAGNOSIS — Z7189 Other specified counseling: Secondary | ICD-10-CM

## 2021-12-29 DIAGNOSIS — R42 Dizziness and giddiness: Secondary | ICD-10-CM

## 2021-12-29 DIAGNOSIS — M25529 Pain in unspecified elbow: Secondary | ICD-10-CM

## 2021-12-29 MED ORDER — PANTOPRAZOLE SODIUM 40 MG PO TBEC: DELAYED_RELEASE_TABLET | ORAL | 3 refills | Status: DC

## 2021-12-29 NOTE — Progress Notes (Unsigned)
CPE- See plan.  Routine anticipatory guidance given to patient.  See health maintenance.  The possibility exists that previously documented standard health maintenance information may have been brought forward from a previous encounter into this note.  If needed, that same information has been updated to reflect the current situation based on today's encounter.    Tdap 2019 Flu shot encouraged Shingles d/w pt. PNA due at 63 covid vaccine 2021.   Colonoscopy done 2020 PSA wnl. Diet and exercise- doing well on both. Labs d/w pt. Living will d/w pt. Son designated if patient were incapacitated. HIV and HCV neg prev.    Taking PPI QD. No GERD on med usually.  Stricture and stenosis of esophagus noted, s/p EGD mult times.    [facility]    The 10-year ASCVD risk score (Arnett DK, et al., 2019) is: 6.6%   Values used to calculate the score:     Age: 60 years     Sex: Male     Is Non-Hispanic African American: No     Diabetic: No     Tobacco smoker: No     Systolic Blood Pressure: 123456 mmHg     Is BP treated: No     HDL Cholesterol: 62.5 mg/dL     Total Cholesterol: 169 mg/dL  Some dec in energy level overall.  Occ episodes of lightheadedness over a few days then it will not be present for a few days.  He felt better after taking gatorade or similar.  D/w pt about inc in fluids vs salt.  No syncope.   Using mouthguard at baseline. Occ L ear pain vs TMJ, not present all the time but occ noted with chewing.  D/w pt about dental visit to replace the guard.    L inguinal area sx resolved with PT.    Medial R elbow discomfort with chin ups.  D/w pt about trying a tennis elbow strap vs going to PT.    PMH and SH reviewed  Meds, vitals, and allergies reviewed.   ROS: Per HPI.  Unless specifically indicated otherwise in HPI, the patient denies:  General: fever. Eyes: acute vision changes ENT: sore throat Cardiovascular: chest pain Respiratory: SOB GI:  vomiting GU: dysuria Musculoskeletal: acute back pain Derm: acute rash Neuro: acute motor dysfunction Psych: worsening mood Endocrine: polydipsia Heme: bleeding Allergy: hayfever  GEN: nad, alert and oriented HEENT: ncat NECK: supple w/o LA CV: rrr. PULM: ctab, no inc wob ABD: soft, +bs EXT: no edema SKIN: no acute rash

## 2021-12-29 NOTE — Patient Instructions (Signed)
See about getting new mouth guard.  Try a tennis elbow strap with the pad on the inside of the forearm, not at the elbow.  Try changing position with pull ups.   Gatorade or similar as needed.  Update me as needed.   Take care.  Glad to see you. Check with your insurance to see if they will cover the shingles shot.

## 2021-12-30 DIAGNOSIS — G8929 Other chronic pain: Secondary | ICD-10-CM | POA: Insufficient documentation

## 2021-12-30 DIAGNOSIS — R42 Dizziness and giddiness: Secondary | ICD-10-CM | POA: Insufficient documentation

## 2021-12-30 DIAGNOSIS — M25529 Pain in unspecified elbow: Secondary | ICD-10-CM | POA: Insufficient documentation

## 2021-12-30 NOTE — Assessment & Plan Note (Signed)
  Tdap 2019 Flu shot encouraged Shingles d/w pt. PNA due at 63 covid vaccine 2021.   Colonoscopy done 2020 PSA wnl. Diet and exercise- doing well on both. Labs d/w pt. Living will d/w pt. Son designated if patient were incapacitated. HIV and HCV neg prev.

## 2021-12-30 NOTE — Assessment & Plan Note (Signed)
  Taking PPI QD. No GERD on med usually.  Stricture and stenosis of esophagus noted prev, s/p EGD mult times.  Swallowing well.  Would continue PPI given his history.

## 2021-12-30 NOTE — Assessment & Plan Note (Signed)
Episodic.  No syncope.  He felt better after drinking Gatorade.  I suspect that the combination of relative low blood pressure, low sodium intake, not quite enough fluid intake.  Discussed increasing fluid/salt intake and being careful about standing quickly.  He can update me as needed.

## 2021-12-30 NOTE — Assessment & Plan Note (Signed)
Living will d/w pt.  Son designated if patient were incapacitated.   

## 2021-12-30 NOTE — Assessment & Plan Note (Signed)
I suspect this is the issue and I asked him to follow-up with the dental clinic about a new mouthguard.

## 2021-12-30 NOTE — Assessment & Plan Note (Signed)
He could have golfers elbow versus ulnar nerve/groove irritation.  Discussed modifying chin-ups and using a golfers elbow strap as needed.  He can update me as needed.

## 2022-12-19 ENCOUNTER — Other Ambulatory Visit: Payer: Self-pay | Admitting: Family Medicine

## 2022-12-19 DIAGNOSIS — Z8639 Personal history of other endocrine, nutritional and metabolic disease: Secondary | ICD-10-CM

## 2022-12-19 DIAGNOSIS — Z125 Encounter for screening for malignant neoplasm of prostate: Secondary | ICD-10-CM

## 2022-12-28 ENCOUNTER — Other Ambulatory Visit: Payer: BC Managed Care – PPO

## 2022-12-28 ENCOUNTER — Other Ambulatory Visit (INDEPENDENT_AMBULATORY_CARE_PROVIDER_SITE_OTHER): Payer: BC Managed Care – PPO

## 2022-12-28 DIAGNOSIS — Z8639 Personal history of other endocrine, nutritional and metabolic disease: Secondary | ICD-10-CM | POA: Diagnosis not present

## 2022-12-28 DIAGNOSIS — Z125 Encounter for screening for malignant neoplasm of prostate: Secondary | ICD-10-CM

## 2022-12-28 LAB — COMPREHENSIVE METABOLIC PANEL
ALT: 18 U/L (ref 0–53)
AST: 29 U/L (ref 0–37)
Albumin: 4.4 g/dL (ref 3.5–5.2)
Alkaline Phosphatase: 66 U/L (ref 39–117)
BUN: 21 mg/dL (ref 6–23)
CO2: 31 mEq/L (ref 19–32)
Calcium: 9.7 mg/dL (ref 8.4–10.5)
Chloride: 102 mEq/L (ref 96–112)
Creatinine, Ser: 1.1 mg/dL (ref 0.40–1.50)
GFR: 71.47 mL/min (ref >60.00)
Glucose, Bld: 96 mg/dL (ref 70–99)
Potassium: 4.8 mEq/L (ref 3.5–5.1)
Sodium: 138 mEq/L (ref 135–145)
Total Bilirubin: 1 mg/dL (ref 0.2–1.2)
Total Protein: 7.1 g/dL (ref 6.0–8.3)

## 2022-12-28 LAB — LIPID PANEL
Cholesterol: 161 mg/dL (ref 0–200)
HDL: 66.2 mg/dL (ref >39.00)
LDL Cholesterol: 85 mg/dL (ref 0–99)
NonHDL: 94.77
Total CHOL/HDL Ratio: 2
Triglycerides: 47 mg/dL (ref 0.0–149.0)
VLDL: 9.4 mg/dL (ref 0.0–40.0)

## 2022-12-28 LAB — PSA: PSA: 0.61 ng/mL (ref 0.10–4.00)

## 2023-01-03 ENCOUNTER — Encounter: Payer: BC Managed Care – PPO | Admitting: Family Medicine

## 2023-01-04 ENCOUNTER — Encounter: Payer: Self-pay | Admitting: Family Medicine

## 2023-01-04 ENCOUNTER — Ambulatory Visit (INDEPENDENT_AMBULATORY_CARE_PROVIDER_SITE_OTHER): Payer: BC Managed Care – PPO | Admitting: Family Medicine

## 2023-01-04 VITALS — BP 110/64 | HR 62 | Temp 98.1°F | Ht 72.0 in | Wt 158.0 lb

## 2023-01-04 DIAGNOSIS — Z7189 Other specified counseling: Secondary | ICD-10-CM

## 2023-01-04 DIAGNOSIS — Z0000 Encounter for general adult medical examination without abnormal findings: Secondary | ICD-10-CM

## 2023-01-04 DIAGNOSIS — K222 Esophageal obstruction: Secondary | ICD-10-CM

## 2023-01-04 MED ORDER — PANTOPRAZOLE SODIUM 40 MG PO TBEC: DELAYED_RELEASE_TABLET | ORAL | 3 refills | Status: DC

## 2023-01-04 NOTE — Progress Notes (Unsigned)
CPE- See plan.  Routine anticipatory guidance given to patient.  See health maintenance.  The possibility exists that previously documented standard health maintenance information may have been brought forward from a previous encounter into this note.  If needed, that same information has been updated to reflect the current situation based on today's encounter.    Tdap 2019 Flu shot encouraged Shingles d/w pt. PNA due at 63 covid vaccine 2021.   Colonoscopy done 2020 PSA wnl. Diet and exercise- doing well on both. Labs d/w pt. Living will d/w pt. Son designated if patient were incapacitated. HIV and HCV neg prev.    He has some medial elbow pain with chin ups.  Using a theragun.  D/w pt limiting reps.  He will update me as needed.  He had covid this year with gradual return to exercise.    Taking PPI QD. No GERD on med usually.  Stricture and stenosis of esophagus noted prev, s/p EGD mult times.  Swallowing well. He is careful about diet at baseline.    He is retired.  D/w pt.  He is baby sitting his grandkids, helping family.  Still exercising.    L upper shin pain when kneeling.  He had intermittent R inguinal pain.  No mass noted.  D/w pt about observation.  He agrees.  He will update me as needed.  Briefly lightheaded upon standing, episodic, can go weeks w/o sx. routine cautions given to patient.  PMH and SH reviewed  Meds, vitals, and allergies reviewed.   ROS: Per HPI.  Unless specifically indicated otherwise in HPI, the patient denies:  General: fever. Eyes: acute vision changes ENT: sore throat Cardiovascular: chest pain Respiratory: SOB GI: vomiting GU: dysuria Musculoskeletal: acute back pain Derm: acute rash Neuro: acute motor dysfunction Psych: worsening mood Endocrine: polydipsia Heme: bleeding Allergy: hayfever  GEN: nad, alert and oriented HEENT: mucous membranes moist NECK: supple w/o LA, chronic changes in the neck noted at baseline. CV: rrr. PULM:  ctab, no inc wob ABD: soft, +bs EXT: no edema SKIN: no acute rash Longstanding bony prominence on the R lower medial rib/sternum.  No overlying skin change.    The 10-year ASCVD risk score (Arnett DK, et al., 2019) is: 6%   Values used to calculate the score:     Age: 62 years     Sex: Male     Is Non-Hispanic African American: No     Diabetic: No     Tobacco smoker: No     Systolic Blood Pressure: 110 mmHg     Is BP treated: No     HDL Cholesterol: 66.2 mg/dL     Total Cholesterol: 161 mg/dL  Discussed ASCVD score.  Would not start a statin at this point.  Would continue work on diet and exercise.

## 2023-01-04 NOTE — Patient Instructions (Signed)
Update me as needed.  Take care.  Glad to see you. Thanks for your effort.   

## 2023-01-05 NOTE — Assessment & Plan Note (Signed)
Taking PPI QD. No GERD on med usually.  Stricture and stenosis of esophagus noted prev, s/p EGD mult times.  Swallowing well. He is careful about diet at baseline.   Would continue pantoprazole as is.

## 2023-01-05 NOTE — Assessment & Plan Note (Signed)
  Tdap 2019 Flu shot encouraged Shingles d/w pt. PNA due at 63 covid vaccine 2021.   Colonoscopy done 2020 PSA wnl. Diet and exercise- doing well on both. Labs d/w pt. Living will d/w pt. Son designated if patient were incapacitated. HIV and HCV neg prev.   

## 2023-01-05 NOTE — Assessment & Plan Note (Signed)
Living will d/w pt.  Son designated if patient were incapacitated.   

## 2023-03-24 ENCOUNTER — Other Ambulatory Visit: Payer: Self-pay | Admitting: Family Medicine

## 2023-04-28 ENCOUNTER — Other Ambulatory Visit: Payer: Self-pay | Admitting: Medical Genetics

## 2023-04-28 DIAGNOSIS — Z006 Encounter for examination for normal comparison and control in clinical research program: Secondary | ICD-10-CM

## 2023-05-27 ENCOUNTER — Ambulatory Visit (INDEPENDENT_AMBULATORY_CARE_PROVIDER_SITE_OTHER)
Admission: RE | Admit: 2023-05-27 | Discharge: 2023-05-27 | Disposition: A | Discharge status: Home / Self Care | Payer: BC Managed Care – PPO | Source: Ambulatory Visit | Attending: Family Medicine | Admitting: Family Medicine

## 2023-05-27 ENCOUNTER — Encounter: Payer: Self-pay | Admitting: Family Medicine

## 2023-05-27 ENCOUNTER — Ambulatory Visit: Payer: BC Managed Care – PPO | Admitting: Family Medicine

## 2023-05-27 VITALS — BP 124/78 | HR 52 | Temp 98.5°F | Ht 72.0 in | Wt 160.2 lb

## 2023-05-27 DIAGNOSIS — M25511 Pain in right shoulder: Secondary | ICD-10-CM

## 2023-05-27 DIAGNOSIS — M25519 Pain in unspecified shoulder: Secondary | ICD-10-CM

## 2023-05-27 NOTE — Patient Instructions (Signed)
Go to the lab on the way out.   If you have mychart we'll likely use that to update you.    Take care.  Glad to see you. Likely refer to PT.

## 2023-05-27 NOTE — Progress Notes (Unsigned)
5th grandson.  He is helping his mother and his grandkids.    R shoulder pain for months.    He had fullness in the R axilla but he didn't feel a lump. It resolved and then came back.  Then it improved in the R axilla again.  He doesn't feel it as much now but it is better.  No L sided sx.    Concurrent R sided soreness in the posterior neck, posterior. Also some fullness w/o specific mass in the R anterior neck.    He stopped doing pull ups and chin ups and overhead press, rows and butterflys and push ups- stopped about 3 weeks ago.    He took aleve for a week and used a theragun.    Meds, vitals, and allergies reviewed.   ROS: Per HPI unless specifically indicated in ROS section   R shoulder crepitus on ROM.    Winging scapula B  Likely is going to need PT.    [facility] :  [phone] Physical Therapy:  [phone] Address [address] [place], [place] [address]

## 2023-05-29 ENCOUNTER — Telehealth: Payer: Self-pay | Admitting: Family Medicine

## 2023-05-29 DIAGNOSIS — M25519 Pain in unspecified shoulder: Secondary | ICD-10-CM

## 2023-05-29 NOTE — Assessment & Plan Note (Addendum)
He could have glenohumeral arthritis.  He does have crepitus on shoulder range of motion.  The fullness that he felt could have been due to local muscle strain/irritation (right sided superior trapezius, sternocleidomastoid, insertion of latissimus dorsi).  Anatomy discussed with patient.  I do not feel any lymphadenopathy and I do not suspect an ominous process.  I think it makes sense to check plain films and then likely refer him to PT. I asked him not to do shoulder exercises in the meantime but cardio should be fine.  He was asking about seeing [facility] :  [phone] Physical Therapy:  [phone] Address [address] [place], [place] [address] See following phone note.

## 2023-05-29 NOTE — Telephone Encounter (Signed)
Please update patient.  I am awaiting the over read on his x-ray.  I do not see any fracture or acute changes.  I think it makes sense to see physical therapy and I put in the referral.  He should get a call about that.  Thanks.

## 2023-05-30 NOTE — Telephone Encounter (Signed)
Called patient reviewed all information and repeated back to me. Will call if any questions.  ? ?

## 2023-06-03 ENCOUNTER — Encounter: Payer: Self-pay | Admitting: *Deleted

## 2023-08-15 ENCOUNTER — Ambulatory Visit: Payer: Self-pay | Admitting: Family Medicine

## 2023-08-15 NOTE — Telephone Encounter (Signed)
Copied from CRM [id number]. Topic: Appointments - Appointment Scheduling >> Aug 15, 2023  9:10 AM Isabell A wrote: Patient experiencing sore throat that has gotten worse, states he previously had a cold. Patient is requesting antibiotics. Also mentions he still has a cough as well.  Decision tree denied scheduling.   Chief Complaint: Sore throat  Symptoms: Sore throat, cough, congestion  Frequency: Constant  Pertinent Negatives: Patient denies fevers, difficulty breathing, rash Disposition: [] ED /[] Urgent Care (no appt availability in office) / [x] Appointment(In office/virtual)/ []  [place] [facility]/ [] Home Care/ [] Refused Recommended Disposition /[]  Mobile Bus/ []  Follow-up with PCP Additional Notes: Patient reports a cold that began a week ago. He states his symptoms have been improving but he has had a constant sore throat since the onset. He states with his sore throat he is still experiencing a cough and some nasal congestion. He denies any fevers, shortness of breath, or rash. Patient reports his throat is red but that there are no white spots visible. Virtual appointment made for the patient tomorrow morning.     Reason for Disposition  [1] Sore throat with cough/cold symptoms AND [2] present > 5 days  Answer Assessment - Initial Assessment Questions 1. ONSET: "When did the throat start hurting?" (Hours or days ago)      2 weeks 2. SEVERITY: "How bad is the sore throat?" (Scale 1-10; mild, moderate or severe)   - MILD (1-3):  Doesn't interfere with eating or normal activities.   - MODERATE (4-7): Interferes with eating some solids and normal activities.   - SEVERE (8-10):  Excruciating pain, interferes with most normal activities.   - SEVERE WITH DYSPHAGIA (10): Can't swallow liquids, drooling.     Mild 3. STREP EXPOSURE: "Has there been any exposure to strep within the past week?" If Yes, ask: "What type of contact occurred?"      Unsure 4.  VIRAL SYMPTOMS: "Are  there any symptoms of a cold, such as a runny nose, cough, hoarse voice or red eyes?"      Cough, nasal congestion  5. FEVER: "Do you have a fever?" If Yes, ask: "What is your temperature, how was it measured, and when did it start?"     No 6. PUS ON THE TONSILS: "Is there pus on the tonsils in the back of your throat?"     No  7. OTHER SYMPTOMS: "Do you have any other symptoms?" (e.g., difficulty breathing, headache, rash)     No  Protocols used: Sore Throat-A-AH

## 2023-08-16 ENCOUNTER — Telehealth (INDEPENDENT_AMBULATORY_CARE_PROVIDER_SITE_OTHER): Payer: Self-pay | Admitting: Primary Care

## 2023-08-16 ENCOUNTER — Encounter: Payer: Self-pay | Admitting: Primary Care

## 2023-08-16 DIAGNOSIS — R051 Acute cough: Secondary | ICD-10-CM | POA: Insufficient documentation

## 2023-08-16 MED ORDER — AMOXICILLIN-POT CLAVULANATE 875-125 MG PO TABS: 1.0000 | ORAL_TABLET | Freq: Two times a day (BID) | ORAL | 0 refills | Status: DC

## 2023-08-16 NOTE — Telephone Encounter (Signed)
Seen virtually today, started on augmentin antibiotic course.

## 2023-08-16 NOTE — Progress Notes (Signed)
Patient ID: Thomas Welch, male    DOB: August 06, 1960, 63 y.o.   MRN: [id number]  Virtual visit completed through caregility, a video enabled telemedicine application. Due to national recommendations of social distancing due to COVID-19, a virtual visit is felt to be most appropriate for this patient at this time. Reviewed limitations, risks, security and privacy concerns of performing a virtual visit and the availability of in person appointments. I also reviewed that there may be a patient responsible charge related to this service. The patient agreed to proceed.   Patient location: home Provider location: [place] [facility], office Persons participating in this virtual visit: patient, provider   If any vitals were documented, they were collected by patient at home unless specified below.    There were no vitals taken for this visit.   CC: Sore throat, cough Subjective:   HPI: Thomas Welch is a 63 y.o. male patient of Dr. Para March with a history of GERD presenting on 08/16/2023 for Sore Throat (X 2 weeks /Sore throat, cough, fatigue /Pt did not do at home testing )  Symptom onset 2 weeks ago with scratchy throat, post nasal drip. He then developed fatigue, cough, continued sore throat.  He continues with cough, sore throat, fatigue which have been persistent since.  His sore throat and cough are bothersome during the day and evening, more noticeable in the morning and in the evening. He's noticed increased cough during the colder weather, chronic for years, worse recently.   He's been gargling salt water, drinking hot tea with lemon/honey, taking OTC cough suppressant, night and day cough/cold, Chloraseptic spray, Mucinex without improvement.   He denies body aches, fevers, headaches, sick contacts.  He is a non-smoker and has never been diagnosed with asthma.  He is compliant to his pantoprazole 40 mg daily.      Relevant past medical, surgical, family and social history reviewed and  updated as indicated. Interim medical history since our last visit reviewed. Allergies and medications reviewed and updated. Outpatient Medications Prior to Visit  Medication Sig Dispense Refill   cyanocobalamin 1000 MCG tablet Take 1,000 mcg by mouth. Once a week     pantoprazole (PROTONIX) 40 MG tablet TAKE 1/2 TO 1 TABLET BY MOUTH DAILY 90 tablet 3   No facility-administered medications prior to visit.     Per HPI unless specifically indicated in ROS section below Review of Systems Objective:  There were no vitals taken for this visit.  Wt Readings from Last 3 Encounters:  05/27/23 160 lb 3.2 oz (72.7 kg)  01/04/23 158 lb (71.7 kg)  12/29/21 160 lb (72.6 kg)       Physical exam: General: Alert and oriented x 3, no distress, does not appear sickly  Pulmonary: Speaks in complete sentences without increased work of breathing, dry cough noted several times during visit.  Psychiatric: Normal mood, thought content, and behavior.     Results for orders placed or performed in visit on 12/28/22  PSA   Collection Time: 12/28/22  9:08 AM  Result Value Ref Range   PSA 0.61 0.10 - 4.00 ng/mL  Lipid panel   Collection Time: 12/28/22  9:08 AM  Result Value Ref Range   Cholesterol 161 0 - 200 mg/dL   Triglycerides 27.2 0.0 - 149.0 mg/dL   HDL 53.66 >44.03 mg/dL   VLDL 9.4 0.0 - 47.4 mg/dL   LDL Cholesterol 85 0 - 99 mg/dL   Total CHOL/HDL Ratio 2  NonHDL 94.77   Comprehensive metabolic panel   Collection Time: 12/28/22  9:08 AM  Result Value Ref Range   Sodium 138 135 - 145 mEq/L   Potassium 4.8 3.5 - 5.1 mEq/L   Chloride 102 96 - 112 mEq/L   CO2 31 19 - 32 mEq/L   Glucose, Bld 96 70 - 99 mg/dL   BUN 21 6 - 23 mg/dL   Creatinine, Ser 1.91 0.40 - 1.50 mg/dL   Total Bilirubin 1.0 0.2 - 1.2 mg/dL   Alkaline Phosphatase 66 39 - 117 U/L   AST 29 0 - 37 U/L   ALT 18 0 - 53 U/L   Total Protein 7.1 6.0 - 8.3 g/dL   Albumin 4.4 3.5 - 5.2 g/dL   GFR 47.82 >95.62 mL/min    Calcium 9.7 8.4 - 10.5 mg/dL   Assessment & Plan:   Problem List Items Addressed This Visit       Other   Acute cough - Primary   Differentials include bacterial/viral etiology, GERD, postnasal drip, reactive airway disease. Exam today limited given virtual platform.  Continue pantoprazole 40 mg daily.  Start Augmentin antibiotics. Take 1 tablet by mouth twice daily for 7 days.  If no improvement then would consider PFTs versus adjustment in GERD treatment. He will update if this is the case.      Relevant Medications   amoxicillin-clavulanate (AUGMENTIN) 875-125 MG tablet     Meds ordered this encounter  Medications   amoxicillin-clavulanate (AUGMENTIN) 875-125 MG tablet    Sig: Take 1 tablet by mouth 2 (two) times daily.    Dispense:  14 tablet    Refill:  0    Supervising Provider:   BEDSOLE, AMY E [[id number]]   No orders of the defined types were placed in this encounter.   I discussed the assessment and treatment plan with the patient. The patient was provided an opportunity to ask questions and all were answered. The patient agreed with the plan and demonstrated an understanding of the instructions. The patient was advised to call back or seek an in-person evaluation if the symptoms worsen or if the condition fails to improve as anticipated.  Follow up plan:  Start Augmentin antibiotics. Take 1 tablet by mouth twice daily for 7 days.  Continue pantoprazole medicine for heartburn.  Please reach out to Dr. Para March if no improvement in your symptoms on the antibiotic medication.  You may need lung function testing or an adjustment in your heartburn medication.   It was a pleasure meeting you!   Doreene Nest, NP

## 2023-08-16 NOTE — Patient Instructions (Signed)
Start Augmentin antibiotics. Take 1 tablet by mouth twice daily for 7 days.  Continue pantoprazole medicine for heartburn.  Please reach out to Dr. Para March if no improvement in your symptoms on the antibiotic medication.  You may need lung function testing or an adjustment in your heartburn medication.   It was a pleasure meeting you!

## 2023-08-16 NOTE — Assessment & Plan Note (Signed)
Differentials include bacterial/viral etiology, GERD, postnasal drip, reactive airway disease. Exam today limited given virtual platform.  Continue pantoprazole 40 mg daily.  Start Augmentin antibiotics. Take 1 tablet by mouth twice daily for 7 days.  If no improvement then would consider PFTs versus adjustment in GERD treatment. He will update if this is the case.

## 2024-01-04 ENCOUNTER — Other Ambulatory Visit: Payer: Self-pay | Admitting: Family Medicine

## 2024-01-04 DIAGNOSIS — Z125 Encounter for screening for malignant neoplasm of prostate: Secondary | ICD-10-CM

## 2024-01-04 DIAGNOSIS — Z8639 Personal history of other endocrine, nutritional and metabolic disease: Secondary | ICD-10-CM

## 2024-01-05 ENCOUNTER — Other Ambulatory Visit (INDEPENDENT_AMBULATORY_CARE_PROVIDER_SITE_OTHER): Payer: Self-pay

## 2024-01-05 ENCOUNTER — Ambulatory Visit: Payer: Self-pay | Admitting: Family Medicine

## 2024-01-05 DIAGNOSIS — Z125 Encounter for screening for malignant neoplasm of prostate: Secondary | ICD-10-CM

## 2024-01-05 DIAGNOSIS — Z8639 Personal history of other endocrine, nutritional and metabolic disease: Secondary | ICD-10-CM | POA: Diagnosis not present

## 2024-01-05 LAB — COMPREHENSIVE METABOLIC PANEL WITH GFR
ALT: 18 U/L (ref 0–53)
AST: 31 U/L (ref 0–37)
Albumin: 4.6 g/dL (ref 3.5–5.2)
Alkaline Phosphatase: 63 U/L (ref 39–117)
BUN: 18 mg/dL (ref 6–23)
CO2: 32 meq/L (ref 19–32)
Calcium: 9.8 mg/dL (ref 8.4–10.5)
Chloride: 102 meq/L (ref 96–112)
Creatinine, Ser: 1.11 mg/dL (ref 0.40–1.50)
GFR: 70.2 mL/min (ref >60.00)
Glucose, Bld: 97 mg/dL (ref 70–99)
Potassium: 4.6 meq/L (ref 3.5–5.1)
Sodium: 139 meq/L (ref 135–145)
Total Bilirubin: 1.2 mg/dL (ref 0.2–1.2)
Total Protein: 7 g/dL (ref 6.0–8.3)

## 2024-01-05 LAB — LIPID PANEL
Cholesterol: 167 mg/dL (ref 0–200)
HDL: 61.9 mg/dL (ref >39.00)
LDL Cholesterol: 97 mg/dL (ref 0–99)
NonHDL: 105.55
Total CHOL/HDL Ratio: 3
Triglycerides: 41 mg/dL (ref 0.0–149.0)
VLDL: 8.2 mg/dL (ref 0.0–40.0)

## 2024-01-05 LAB — PSA: PSA: 0.54 ng/mL (ref 0.10–4.00)

## 2024-01-10 ENCOUNTER — Encounter: Payer: Self-pay | Admitting: Family Medicine

## 2024-01-10 ENCOUNTER — Ambulatory Visit (INDEPENDENT_AMBULATORY_CARE_PROVIDER_SITE_OTHER): Payer: Self-pay | Admitting: Family Medicine

## 2024-01-10 VITALS — BP 128/74 | HR 51 | Temp 98.6°F | Ht 70.91 in | Wt 153.4 lb

## 2024-01-10 DIAGNOSIS — Z0000 Encounter for general adult medical examination without abnormal findings: Secondary | ICD-10-CM

## 2024-01-10 DIAGNOSIS — Z7189 Other specified counseling: Secondary | ICD-10-CM

## 2024-01-10 DIAGNOSIS — M25519 Pain in unspecified shoulder: Secondary | ICD-10-CM

## 2024-01-10 DIAGNOSIS — K222 Esophageal obstruction: Secondary | ICD-10-CM

## 2024-01-10 DIAGNOSIS — R42 Dizziness and giddiness: Secondary | ICD-10-CM

## 2024-01-10 DIAGNOSIS — R0981 Nasal congestion: Secondary | ICD-10-CM

## 2024-01-10 MED ORDER — PANTOPRAZOLE SODIUM 40 MG PO TBEC: DELAYED_RELEASE_TABLET | ORAL | 3 refills | Status: DC

## 2024-01-10 NOTE — Patient Instructions (Addendum)
 I would get a flu shot each fall.   Reasonable to get a pneumonia vaccine after turning 63.  Take care.  Glad to see you. Let me know if you need to see ENT.

## 2024-01-10 NOTE — Progress Notes (Unsigned)
 CPE- See plan.  Routine anticipatory guidance given to patient.  See health maintenance.  The possibility exists that previously documented standard health maintenance information may have been brought forward from a previous encounter into this note.  If needed, that same information has been updated to reflect the current situation based on today's encounter.    Tdap 2019 Flu shot 2024 Shingles prev done.  PNA can be done 63 covid vaccine 2021.   Colonoscopy done 2020 PSA wnl. Diet and exercise- doing well on both. Labs d/w pt. Living will d/w pt. Son designated if patient were incapacitated. HIV and HCV neg prev.    Sleep can be disrupted by nasal congestion vs joint pain.  He can deviate his skin on the L side of the nose laterally and improve his symptoms. I asked him to update me as needed.    He is still caring for his mother who is turning 95 tomorrow.  He is helping care for his grandkids.    Still on PPI at baseline.  Swallowing well.  He cut back on soda.    He is still doing HEP for shoulder pain.  He is functional as is, able to do pull ups.    He can get lightheaded on standing, cautions d/w pt.  This is longstanding.    He has occ leg discomfort after but not during exercise that improves with thera gun/massage. He is still exercising and stretching.    Longstanding medial L shin with prominent saphenous vein that isn't tender.  No bleeding.  D/w pt.    PMH and SH reviewed  Meds, vitals, and allergies reviewed.   ROS: Per HPI.  Unless specifically indicated otherwise in HPI, the patient denies:  General: fever. Eyes: acute vision changes ENT: sore throat Cardiovascular: chest pain Respiratory: SOB GI: vomiting GU: dysuria Musculoskeletal: acute back pain Derm: acute rash Neuro: acute motor dysfunction Psych: worsening mood Endocrine: polydipsia Heme: bleeding Allergy: hayfever  GEN: nad, alert and oriented HEENT: mucous membranes moist NECK: supple  w/o LA CV: rrr. PULM: ctab, no inc wob ABD: soft, +bs EXT: no edema SKIN: no acute rash Nose mildly deviated laterally to L and L nostril stuffy.   I don't see any alarming skin lesions.  2 benign appearing papules on the L face/cheek.

## 2024-01-11 NOTE — Assessment & Plan Note (Signed)
 He is continue with his home exercise program and he can update me as needed.

## 2024-01-11 NOTE — Assessment & Plan Note (Signed)
 Tdap 2019 Flu shot 2024 Shingles prev done.  PNA can be done 63 covid vaccine 2021.   Colonoscopy done 2020 PSA wnl. Diet and exercise- doing well on both. Labs d/w pt. Living will d/w pt. Son designated if patient were incapacitated. HIV and HCV neg prev.

## 2024-01-11 NOTE — Assessment & Plan Note (Signed)
 History of, would continue PPI at baseline.

## 2024-01-11 NOTE — Assessment & Plan Note (Signed)
 Occasionally noted.  Cautions discussed with patient.  I suspect this is related to relatively low blood pressure and heart rate, both of which are likely fitness related.

## 2024-01-11 NOTE — Assessment & Plan Note (Signed)
 I asked him to update me if his symptoms are bothersome enough to go through with ENT evaluation.  His left nostril is stuffy and he has mild deviation of the nose to the left which likely contributes to his symptoms.  I do not see any ominous findings otherwise on exam.

## 2024-01-11 NOTE — Assessment & Plan Note (Signed)
Living will d/w pt.  Son designated if patient were incapacitated.   

## 2024-04-30 ENCOUNTER — Other Ambulatory Visit: Payer: Self-pay | Admitting: Family Medicine

## 2024-05-16 ENCOUNTER — Other Ambulatory Visit: Payer: Self-pay | Admitting: Medical Genetics

## 2024-05-16 DIAGNOSIS — Z006 Encounter for examination for normal comparison and control in clinical research program: Secondary | ICD-10-CM

## 2024-06-20 LAB — GENECONNECT MOLECULAR SCREEN: Genetic Analysis Overall Interpretation: NEGATIVE
